# Patient Record
Sex: Male | Born: 1990 | Race: Black or African American | Hispanic: No | Marital: Single | Smoking: Current every day smoker
Health system: Southern US, Community
[De-identification: ages and names within clinical notes are randomized; demographics above are authoritative.]

---
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
## 2009-05-03 ENCOUNTER — Emergency Department: Payer: Self-pay | Admitting: Emergency Medicine

## 2010-09-24 ENCOUNTER — Emergency Department: Payer: Self-pay | Admitting: Emergency Medicine

## 2012-12-31 ENCOUNTER — Emergency Department: Payer: Self-pay | Admitting: Emergency Medicine

## 2015-05-06 ENCOUNTER — Emergency Department
Admission: EM | Admit: 2015-05-06 | Discharge: 2015-05-06 | Disposition: A | Discharge status: Home / Self Care | Payer: Self-pay | Attending: Emergency Medicine | Admitting: Emergency Medicine

## 2015-05-06 ENCOUNTER — Encounter: Payer: Self-pay | Admitting: Emergency Medicine

## 2015-05-06 DIAGNOSIS — F17200 Nicotine dependence, unspecified, uncomplicated: Secondary | ICD-10-CM | POA: Insufficient documentation

## 2015-05-06 DIAGNOSIS — B001 Herpesviral vesicular dermatitis: Secondary | ICD-10-CM | POA: Insufficient documentation

## 2015-05-06 MED ORDER — ACYCLOVIR 5 % EX OINT: TOPICAL_OINTMENT | CUTANEOUS | Status: AC

## 2015-05-06 MED ORDER — VALACYCLOVIR HCL 1 G PO TABS: 2000.0000 mg | ORAL_TABLET | Freq: Two times a day (BID) | ORAL | Status: DC

## 2015-05-06 NOTE — ED Notes (Signed)
Pt has swelling and redness to top right lip that came up tonight.

## 2015-05-06 NOTE — Discharge Instructions (Signed)
Cold Sore A cold sore (fever blister) is a skin infection caused by a certain type of germ (virus). They are small sores filled with fluid that dry up and heal within 2 weeks. Cold sores form inside of the mouth or on the lips, gums, and other parts of the body. Cold sores can be easily passed (contagious) to other people. This can happen through close personal contact, such as kissing or sharing a drinking glass. HOME CARE  Only take medicine as told by your doctor. Do not use aspirin.  Use a cotton-tip swab to put creams or gels on your sores.  Do not touch sores or pick scabs. Wash your hands often. Do not touch your eyes without washing your hands first.  Avoid kissing, oral sex, and sharing personal items until the sores heal.  Put an ice pack on your sores for 10-15 minutes to ease discomfort.  Avoid hot, cold, or salty foods. Eat a soft, bland diet. Use a straw to drink if it helps lessen pain.  Keep sores clean and dry.  Avoid the sun and limit stress if these things cause you to have sores. Apply sunscreen on your lips if the sun causes cold sores. GET HELP IF:  You have a fever or lasting symptoms for more than 2-3 days.  You have a fever and your symptoms suddenly get worse.  You have yellow-white fluid (not clear) coming from the sores.  You have redness that is spreading.  You have pain or irritation in your eye.  You get sores on your genitals.  Your sores do not heal within 2 weeks.  You have a tough time fighting off sickness and infections (weakened immune system).  You get cold sores often. MAKE SURE YOU:   Understand these instructions.  Will watch your condition.  Will get help right away if you are not doing well or get worse.   This information is not intended to replace advice given to you by your health care provider. Make sure you discuss any questions you have with your health care provider.   Document Released: 11/30/2011 Document Reviewed:  11/30/2011 Elsevier Interactive Patient Education [employer]2016 [employer]vier Inc.  Take the prescription meds as directed.

## 2015-05-06 NOTE — ED Provider Notes (Signed)
[facility] Emergency Department Provider Note ____________________________________________  Time seen: 2130  I have reviewed the triage vital signs and the nursing notes.  HISTORY  Chief Complaint  Oral Swelling  HPI John Spence is a 24 y.o. male awoke this morning and noted multiple,scabbed blisters to his upper lip on the right side. He does give a history of fever blisters or cold sores in the past.He admittedly peeled off the scab and squeeze the swelling to his upper lip prior to arrival. He denies fever, chills, sweats.  History reviewed. No pertinent past medical history.  There are no active problems to display for this patient.  History reviewed. No pertinent past surgical history.  Current Outpatient Rx  Name  Route  Sig  Dispense  Refill  . acyclovir ointment (ZOVIRAX) 5 %      Apply thin layer to affected area 6 times daily   15 g   0   . valACYclovir (VALTREX) 1000 MG tablet   Oral   Take 2 tablets (2,000 mg total) by mouth every 12 (twelve) hours. Herpes labialis   4 tablet   0    Allergies Review of patient's allergies indicates no known allergies.  History reviewed. No pertinent family history.  Social History Social History  Substance Use Topics  . Smoking status: Heavy Tobacco Smoker -- 0.50 packs/day  . Smokeless tobacco: None  . Alcohol Use: Yes   Review of Systems  Constitutional: Negative for fever. Eyes: Negative for visual changes. ENT: Negative for sore throat. Cardiovascular: Negative for chest pain. Respiratory: Negative for shortness of breath. Gastrointestinal: Negative for abdominal pain, vomiting and diarrhea. Genitourinary: Negative for dysuria. Musculoskeletal: Negative for back pain. Skin: Negative for rash. Lip swelling as above Neurological: Negative for headaches, focal weakness or numbness. ____________________________________________  PHYSICAL EXAM:  VITAL SIGNS: ED Triage Vitals  Enc  Vitals Group     BP 05/06/15 2023 139/80 mmHg     Pulse Rate 05/06/15 2023 94     Resp 05/06/15 2023 18     Temp 05/06/15 2023 98.6 F (37 C)     Temp Source 05/06/15 2023 Oral     SpO2 05/06/15 2023 100 %     Weight 05/06/15 2023 160 lb (72.576 kg)     Height 05/06/15 2023 6' (1.829 m)     Head Cir --      Peak Flow --      Pain Score 05/06/15 2030 0     Pain Loc --      Pain Edu? --      Excl. in GC? --    Constitutional: Alert and oriented. Well appearing and in no distress. Head: Normocephalic and atraumatic.      Eyes: Conjunctivae are normal. PERRL. Normal extraocular movements      Ears: Canals clear. TMs intact bilaterally.   Nose: No congestion/rhinorrhea.   Mouth/Throat: Mucous membranes are moist.   Neck: Supple. No thyromegaly. Hematological/Lymphatic/Immunological: No cervical lymphadenopathy. Cardiovascular: Normal rate, regular rhythm.  Respiratory: Normal respiratory effort. No wheezes/rales/rhonchi. Gastrointestinal: Soft and nontender. No distention. Musculoskeletal: Nontender with normal range of motion in all extremities.  Neurologic:  Normal gait without ataxia. Normal speech and language. No gross focal neurologic deficits are appreciated. Skin:  Skin is warm, dry and intact. No rash noted. Upper lip with local soft tissue swelling and superficial ulceration noted. Psychiatric: Mood and affect are normal. Patient exhibits appropriate insight and judgment. ____________________________________________  INITIAL IMPRESSION / ASSESSMENT AND PLAN /  ED COURSE  Treatment for a recurrent herpes labialis. Patient was treated with Zovirax ointment and valacyclovir. I make her provider for ongoing symptoms. ____________________________________________  FINAL CLINICAL IMPRESSION(S) / ED DIAGNOSES  Final diagnoses:  Recurrent herpes labialis      Lissa HoardJenise V Bacon Therisa Mennella, PA-C 05/06/15 2149  Richardean Canalavid H Yao, MD 05/06/15 2315

## 2015-05-06 NOTE — ED Notes (Signed)
Pt arrived to the ED for complaints of lip abrasion and swelling. Pt states that he woke up to go to work and noticed that his lip was swollen. Pt denies any other symptoms. Pt is AOx4 in no apparent distress.

## 2015-05-06 NOTE — ED Notes (Signed)
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 No hx of the same. Denies injury.

## 2015-09-06 ENCOUNTER — Emergency Department
Admission: EM | Admit: 2015-09-06 | Discharge: 2015-09-06 | Disposition: A | Discharge status: Home / Self Care | Payer: Self-pay | Attending: Emergency Medicine | Admitting: Emergency Medicine

## 2015-09-06 ENCOUNTER — Encounter: Payer: Self-pay | Admitting: Emergency Medicine

## 2015-09-06 DIAGNOSIS — J00 Acute nasopharyngitis [common cold]: Secondary | ICD-10-CM | POA: Insufficient documentation

## 2015-09-06 DIAGNOSIS — F17210 Nicotine dependence, cigarettes, uncomplicated: Secondary | ICD-10-CM | POA: Insufficient documentation

## 2015-09-06 LAB — POCT RAPID STREP A: STREPTOCOCCUS, GROUP A SCREEN (DIRECT): NEGATIVE

## 2015-09-06 NOTE — Discharge Instructions (Signed)

## 2015-09-06 NOTE — ED Provider Notes (Signed)
[facility] Emergency Department Provider Note  ____________________________________________  Time seen: Approximately 10:01 AM  I have reviewed the triage vital signs and the nursing notes.   HISTORY  Chief Complaint Sore Throat    HPI John Spence is a 25 y.o. male , NAD, presents to the emergency department with history of sore throat that began yesterday. Some nasal congestion, runny nose. Has not had any fevers, chills, body aches. He denies any abdominal pain, nausea, vomiting nor any rashes. Denies any sick contacts. Has not had any upper respiratory symptoms such as sinus pressure, ear pain, headaches.   History reviewed. No pertinent past medical history.  There are no active problems to display for this patient.   History reviewed. No pertinent past surgical history.  Current Outpatient Rx  Name  Route  Sig  Dispense  Refill  . acyclovir ointment (ZOVIRAX) 5 %      Apply thin layer to affected area 6 times daily   15 g   0   . valACYclovir (VALTREX) 1000 MG tablet   Oral   Take 2 tablets (2,000 mg total) by mouth every 12 (twelve) hours. Herpes labialis   4 tablet   0     Allergies Review of patient's allergies indicates no known allergies.  No family history on file.  Social History Social History  Substance Use Topics  . Smoking status: Heavy Tobacco Smoker -- 1.00 packs/day    Types: Cigarettes  . Smokeless tobacco: None  . Alcohol Use: Yes     Review of Systems  Constitutional: No fever/chills, fatigue. Eyes: No visual changes. No discharge, redness, swelling ENT: Positive sore throat, nasal congestion, runny nose. No ear pain, sinus pressure. Cardiovascular: No chest pain. Respiratory: No cough, shortness of breath, wheezing.  Gastrointestinal: No abdominal pain.  No nausea, vomiting.  No diarrhea.   Musculoskeletal: Negative for general myalgias.  Skin: Negative for rash. Neurological: Negative for headaches,  focal weakness or numbness. 10-point ROS otherwise negative.  ____________________________________________   PHYSICAL EXAM:  VITAL SIGNS: ED Triage Vitals  Enc Vitals Group     BP 09/06/15 0939 124/78 mmHg     Pulse Rate 09/06/15 0939 94     Resp 09/06/15 0939 20     Temp 09/06/15 0939 97.6 F (36.4 C)     Temp Source 09/06/15 0939 Oral     SpO2 09/06/15 0939 100 %     Weight 09/06/15 0939 160 lb (72.576 kg)     Height 09/06/15 0939  (1.854 m)     Head Cir --      Peak Flow --      Pain Score 09/06/15 0942 6     Pain Loc --      Pain Edu? --      Excl. in GC? --     Constitutional: Alert and oriented. Well appearing and in no acute distress. Eyes: Conjunctivae are normal.  Head: Atraumatic. ENT:      Ears: TMs visualized bilaterally without effusion, bulging, perforation, erythema      Nose: No congestion but trace clear rhinnorhea. Turbinates mildly injected      Mouth/Throat: Mucous membranes are moist. Pharynx with mild injection and clear postnasal drip. No pharyngeal swelling or exudates. Neck: Supple with full range of motion Hematological/Lymphatic/Immunilogical: No cervical lymphadenopathy. Cardiovascular: Normal rate, regular rhythm. Normal S1 and S2.  Good peripheral circulation. Respiratory: Normal respiratory effort without tachypnea or retractions. Lungs CTAB with breath sounds noted throughout. Neurologic:  Normal speech and language. No gross focal neurologic deficits are appreciated.  Skin:  Skin is warm, dry and intact. No rash noted. Psychiatric: Mood and affect are normal. Speech and behavior are normal. Patient exhibits appropriate insight and judgement.   ____________________________________________   LABS (all labs ordered are listed, but only abnormal results are displayed)  Labs Reviewed  POCT RAPID STREP A    ____________________________________________  EKG  None ____________________________________________  RADIOLOGY  None ____________________________________________    PROCEDURES  Procedure(s) performed: None    Medications - No data to display   ____________________________________________   INITIAL IMPRESSION / ASSESSMENT AND PLAN / ED COURSE  Pertinent lab results that were available during my care of the patient were reviewed by me and considered in my medical decision making (see chart for details).  Patient's diagnosis is consistent with acute nasopharyngitis. Patient will be discharged home with instructions to take over-the-counter cold medications as needed. Patient verbalizes he prefers to avoid medications and prefers a natural approach. I suggested he could take local honey on a daily basis to improve immunity and reduce seasonal allergies. Patient [facility] to [facility] if symptoms persist past this treatment course. Patient is given ED precautions to return to the ED for any worsening or new symptoms.    ____________________________________________  FINAL CLINICAL IMPRESSION(S) / ED DIAGNOSES  Final diagnoses:  Acute nasopharyngitis (common cold)      NEW MEDICATIONS STARTED DURING THIS VISIT:  New Prescriptions   No medications on file         Hope PigeonJami L Hagler, PA-C 09/06/15 1038  Jeanmarie PlantJames A McShane, MD 09/06/15 1228

## 2015-09-06 NOTE — ED Notes (Signed)
Sore throat since yesterday.

## 2015-12-11 ENCOUNTER — Emergency Department
Admission: EM | Admit: 2015-12-11 | Discharge: 2015-12-11 | Disposition: A | Discharge status: Home / Self Care | Payer: Self-pay | Attending: Emergency Medicine | Admitting: Emergency Medicine

## 2015-12-11 ENCOUNTER — Emergency Department: Payer: Self-pay

## 2015-12-11 DIAGNOSIS — X58XXXA Exposure to other specified factors, initial encounter: Secondary | ICD-10-CM | POA: Insufficient documentation

## 2015-12-11 DIAGNOSIS — Y999 Unspecified external cause status: Secondary | ICD-10-CM | POA: Insufficient documentation

## 2015-12-11 DIAGNOSIS — F17210 Nicotine dependence, cigarettes, uncomplicated: Secondary | ICD-10-CM | POA: Insufficient documentation

## 2015-12-11 DIAGNOSIS — T148XXA Other injury of unspecified body region, initial encounter: Secondary | ICD-10-CM

## 2015-12-11 DIAGNOSIS — S29012A Strain of muscle and tendon of back wall of thorax, initial encounter: Secondary | ICD-10-CM | POA: Insufficient documentation

## 2015-12-11 DIAGNOSIS — Y939 Activity, unspecified: Secondary | ICD-10-CM | POA: Insufficient documentation

## 2015-12-11 DIAGNOSIS — Y929 Unspecified place or not applicable: Secondary | ICD-10-CM | POA: Insufficient documentation

## 2015-12-11 MED ORDER — MELOXICAM 15 MG PO TABS: 15.0000 mg | ORAL_TABLET | Freq: Every day | ORAL | Status: DC
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
## 2015-12-11 NOTE — ED Notes (Addendum)
Pt arrives to ER via POV c/o upper back and lower neck pain X 2-3 months. Pt states that pain worse when he is at work. Pt alert and oriented X4, active, cooperative, pt in NAD. RR even and unlabored, color WNL.  Ambulatory.  Denies injury.

## 2016-12-11 ENCOUNTER — Emergency Department: Payer: Self-pay

## 2016-12-11 ENCOUNTER — Emergency Department
Admission: EM | Admit: 2016-12-11 | Discharge: 2016-12-11 | Discharge status: Against Medical Advice | Payer: Self-pay | Attending: Emergency Medicine | Admitting: Emergency Medicine

## 2016-12-11 DIAGNOSIS — M79622 Pain in left upper arm: Secondary | ICD-10-CM | POA: Insufficient documentation

## 2016-12-11 DIAGNOSIS — Z5321 Procedure and treatment not carried out due to patient leaving prior to being seen by health care provider: Secondary | ICD-10-CM | POA: Insufficient documentation
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
## 2016-12-12 ENCOUNTER — Emergency Department
Admission: EM | Admit: 2016-12-12 | Discharge: 2016-12-12 | Disposition: A | Discharge status: Home / Self Care | Payer: Self-pay | Attending: Emergency Medicine | Admitting: Emergency Medicine

## 2016-12-12 DIAGNOSIS — M791 Myalgia: Secondary | ICD-10-CM | POA: Insufficient documentation

## 2016-12-12 DIAGNOSIS — Z791 Long term (current) use of non-steroidal anti-inflammatories (NSAID): Secondary | ICD-10-CM | POA: Insufficient documentation

## 2016-12-12 DIAGNOSIS — F17210 Nicotine dependence, cigarettes, uncomplicated: Secondary | ICD-10-CM | POA: Insufficient documentation

## 2016-12-12 DIAGNOSIS — M7918 Myalgia, other site: Secondary | ICD-10-CM

## 2016-12-12 MED ORDER — NAPROXEN 500 MG PO TABS: 500.0000 mg | ORAL_TABLET | Freq: Two times a day (BID) | ORAL | 0 refills | Status: DC

## 2016-12-12 MED ORDER — CYCLOBENZAPRINE HCL 10 MG PO TABS: 10.0000 mg | ORAL_TABLET | Freq: Three times a day (TID) | ORAL | 0 refills | Status: DC | PRN
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
## 2016-12-12 NOTE — Discharge Instructions (Signed)
Follow up with the primary care provider of your choice for symptoms that are not improving over the next week or so. Return to the ER for symptoms that change or worsen if you are unable to schedule an appointment.
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
## 2017-02-20 ENCOUNTER — Emergency Department
Admission: EM | Admit: 2017-02-20 | Discharge: 2017-02-20 | Disposition: A | Discharge status: Home / Self Care | Payer: Self-pay | Attending: Emergency Medicine | Admitting: Emergency Medicine

## 2017-02-20 DIAGNOSIS — Z79899 Other long term (current) drug therapy: Secondary | ICD-10-CM | POA: Insufficient documentation

## 2017-02-20 DIAGNOSIS — A749 Chlamydial infection, unspecified: Secondary | ICD-10-CM | POA: Insufficient documentation

## 2017-02-20 DIAGNOSIS — F17210 Nicotine dependence, cigarettes, uncomplicated: Secondary | ICD-10-CM | POA: Insufficient documentation

## 2017-02-20 DIAGNOSIS — A64 Unspecified sexually transmitted disease: Secondary | ICD-10-CM

## 2017-02-20 LAB — CHLAMYDIA/NGC RT PCR (ARMC ONLY)
CHLAMYDIA TR: DETECTED — AB
N GONORRHOEAE: NOT DETECTED

## 2017-02-20 LAB — GLUCOSE, CAPILLARY: Glucose-Capillary: 83 mg/dL (ref 65–99)

## 2017-02-20 LAB — URINALYSIS, COMPLETE (UACMP) WITH MICROSCOPIC
Bacteria, UA: NONE SEEN
Bilirubin Urine: NEGATIVE
Glucose, UA: NEGATIVE mg/dL
Hgb urine dipstick: NEGATIVE
Ketones, ur: NEGATIVE mg/dL
Leukocytes, UA: NEGATIVE
Nitrite: NEGATIVE
PH: 5 (ref 5.0–8.0)
Protein, ur: NEGATIVE mg/dL
SPECIFIC GRAVITY, URINE: 1.031 — AB (ref 1.005–1.030)
SQUAMOUS EPITHELIAL / LPF: NONE SEEN

## 2017-02-20 LAB — BASIC METABOLIC PANEL
ANION GAP: 10 (ref 5–15)
BUN: 16 mg/dL (ref 6–20)
CO2: 27 mmol/L (ref 22–32)
Calcium: 9.7 mg/dL (ref 8.9–10.3)
Chloride: 103 mmol/L (ref 101–111)
Creatinine, Ser: 0.98 mg/dL (ref 0.61–1.24)
GFR calc Af Amer: >60 mL/min (ref >60)
Glucose, Bld: 85 mg/dL (ref 65–99)
POTASSIUM: 4 mmol/L (ref 3.5–5.1)
Sodium: 140 mmol/L (ref 135–145)

## 2017-02-20 LAB — CBC
HCT: 42 % (ref 40.0–52.0)
Hemoglobin: 14.6 g/dL (ref 13.0–18.0)
MCH: 32.3 pg (ref 26.0–34.0)
MCHC: 34.7 g/dL (ref 32.0–36.0)
MCV: 93 fL (ref 80.0–100.0)
PLATELETS: 240 10*3/uL (ref 150–440)
RBC: 4.52 MIL/uL (ref 4.40–5.90)
RDW: 13.5 % (ref 11.5–14.5)
WBC: 6.3 10*3/uL (ref 3.8–10.6)

## 2017-02-20 MED ORDER — AZITHROMYCIN 500 MG PO TABS
1000.0000 mg | ORAL_TABLET | Freq: Once | ORAL | Status: AC
  Administered 2017-02-20: 1000 mg via ORAL
  Filled 2017-02-20: qty 2

## 2017-02-20 MED ORDER — CEFTRIAXONE SODIUM 250 MG IJ SOLR
250.0000 mg | Freq: Once | INTRAMUSCULAR | Status: AC
  Administered 2017-02-20: 250 mg via INTRAMUSCULAR
  Filled 2017-02-20: qty 250

## 2017-02-20 NOTE — ED Triage Notes (Signed)
FIRST NURSE NOTE-ambulatory to triage. Migraine and increased urination. NAD

## 2017-02-20 NOTE — ED Triage Notes (Signed)
Pt presents via POV c/o urinary frequency x1 month. Requesting STD check.

## 2017-02-20 NOTE — ED Provider Notes (Signed)
[facility] Emergency Department Provider Note  ____________________________________________  Time seen: Approximately 2:23 PM  I have reviewed the triage vital signs and the nursing notes.   HISTORY  Chief Complaint Urinary Frequency    HPI John Spence is a 26 y.o. male that presents to the emergency department with urinary frequency for 1 month. Patient states that he finishes urination and feels like he needs to go again shortly after. He states that he is very sexually active and concerned for STDs. He states that "it's weird because I'm not having any pain anywhere." He denies fever, shortness breath, chest pain, nausea, vomiting, abdominal pain, back pain, dysuria, urgency, penile discharge.   No past medical history on file.  There are no active problems to display for this patient.   No past surgical history on file.  Prior to Admission medications   Medication Sig Start Date End Date Taking? Authorizing Provider  cyclobenzaprine (FLEXERIL) 10 MG tablet Take 1 tablet (10 mg total) by mouth 3 (three) times daily as needed for muscle spasms. 12/12/16   Triplett, Rulon Eisenmengerari B, FNP  meloxicam (MOBIC) 15 MG tablet Take 1 tablet (15 mg total) by mouth daily. 12/11/15   Triplett, Rulon Eisenmengerari B, FNP  naproxen (NAPROSYN) 500 MG tablet Take 1 tablet (500 mg total) by mouth 2 (two) times daily with a meal. 12/12/16   Triplett, Cari B, FNP  valACYclovir (VALTREX) 1000 MG tablet Take 2 tablets (2,000 mg total) by mouth every 12 (twelve) hours. Herpes labialis 05/06/15   Menshew, Charlesetta IvoryJenise V Bacon, PA-C    Allergies Patient has no known allergies.  No family history on file.  Social History Social History  Substance Use Topics  . Smoking status: Heavy Tobacco Smoker    Packs/day: 1.00    Types: Cigarettes  . Smokeless tobacco: Never Used  . Alcohol use Yes     Review of Systems  Cardiovascular: No chest pain. Respiratory:  No SOB. Gastrointestinal: No abdominal  pain.  No nausea, no vomiting.  Genitourinary: Negative for dysuria. Musculoskeletal: Negative for musculoskeletal pain. Skin: Negative for rash, abrasions, lacerations, ecchymosis. Neurological: Negative for headaches   ____________________________________________   PHYSICAL EXAM:  VITAL SIGNS: ED Triage Vitals [02/20/17 1104]  Enc Vitals Group     BP 140/84     Pulse Rate 64     Resp 14     Temp 98.4 F (36.9 C)     Temp Source Oral     SpO2 100 %     Weight 160 lb (72.6 kg)     Height 5\' 11"  (1.803 m)     Head Circumference      Peak Flow      Pain Score      Pain Loc      Pain Edu?      Excl. in GC?      Constitutional: Alert and oriented. Well appearing and in no acute distress. Eyes: Conjunctivae are normal. PERRL. EOMI. Head: Atraumatic. ENT:      Ears:      Nose: No congestion/rhinnorhea.      Mouth/Throat: Mucous membranes are moist.  Neck: No stridor.   Cardiovascular: Normal rate, regular rhythm.  Good peripheral circulation. Respiratory: Normal respiratory effort without tachypnea or retractions. Lungs CTAB. Good air entry to the bases with no decreased or absent breath sounds. Gastrointestinal: Bowel sounds 4 quadrants. Soft and nontender to palpation. No guarding or rigidity. No palpable masses. No distention. No CVA tenderness. Musculoskeletal: Full range  of motion to all extremities. No gross deformities appreciated. Neurologic:  Normal speech and language. No gross focal neurologic deficits are appreciated.  Skin:  Skin is warm, dry and intact. No rash noted.   ____________________________________________   LABS (all labs ordered are listed, but only abnormal results are displayed)  Labs Reviewed  CHLAMYDIA/NGC RT PCR ([facility] ONLY) - Abnormal; Notable for the following:       Result Value   Chlamydia Tr DETECTED (*)    All other components within normal limits  URINALYSIS, COMPLETE (UACMP) WITH MICROSCOPIC - Abnormal; Notable for  the following:    Color, Urine YELLOW (*)    APPearance CLEAR (*)    Specific Gravity, Urine 1.031 (*)    All other components within normal limits  GLUCOSE, CAPILLARY  CBC  BASIC METABOLIC PANEL  CBG MONITORING, ED   ____________________________________________  EKG   ____________________________________________  RADIOLOGY  No results found.  ____________________________________________    PROCEDURES  Procedure(s) performed:    Procedures    Medications  cefTRIAXone (ROCEPHIN) injection 250 mg (250 mg Intramuscular Given 02/20/17 1508)  azithromycin (ZITHROMAX) tablet 1,000 mg (1,000 mg Oral Given 02/20/17 1504)     ____________________________________________   INITIAL IMPRESSION / ASSESSMENT AND PLAN / ED COURSE  Pertinent labs & imaging results that were available during my care of the patient were reviewed by me and considered in my medical decision making (see chart for details).  Review of the [place] CSRS was performed in accordance of the [facility] prior to dispensing any controlled drugs.   Patients diagnosis is consistent with chlamydia. Patient presented to the emergency department for evaluation of urinary frequency. Vital signs, exam, lab work are reassuring. No infection on urinalysis. Chlamydia test is positive. Patient refuses rectal exam to check for prostatitis. He was treated for chlamydia with ceftriaxone and azithromycin. Education about STDs was provided. Patient is to follow up with PCP as directed. Patient is given ED precautions to return to the ED for any worsening or new symptoms.     ____________________________________________  FINAL CLINICAL IMPRESSION(S) / ED DIAGNOSES  Final diagnoses:  STD (sexually transmitted disease)  Chlamydia      NEW MEDICATIONS STARTED DURING THIS VISIT:  Discharge Medication List as of 02/20/2017  3:13 PM          This chart was dictated using voice recognition software/Dragon. Despite best efforts  to proofread, errors can occur which can change the meaning. Any change was purely unintentional.    Enid Derry, PA-C 02/20/17 1706    Sharyn Creamer, MD 02/26/17 2157

## 2017-04-27 ENCOUNTER — Other Ambulatory Visit: Payer: Self-pay

## 2017-04-27 ENCOUNTER — Emergency Department
Admission: EM | Admit: 2017-04-27 | Discharge: 2017-04-27 | Disposition: A | Discharge status: Home / Self Care | Payer: Self-pay | Attending: Emergency Medicine | Admitting: Emergency Medicine

## 2017-04-27 ENCOUNTER — Encounter: Payer: Self-pay | Admitting: *Deleted

## 2017-04-27 DIAGNOSIS — Z791 Long term (current) use of non-steroidal anti-inflammatories (NSAID): Secondary | ICD-10-CM | POA: Insufficient documentation

## 2017-04-27 DIAGNOSIS — B349 Viral infection, unspecified: Secondary | ICD-10-CM | POA: Insufficient documentation

## 2017-04-27 DIAGNOSIS — F17210 Nicotine dependence, cigarettes, uncomplicated: Secondary | ICD-10-CM | POA: Insufficient documentation

## 2017-04-27 LAB — INFLUENZA PANEL BY PCR (TYPE A & B)
Influenza A By PCR: NEGATIVE
Influenza B By PCR: NEGATIVE

## 2017-04-27 LAB — POCT RAPID STREP A: Streptococcus, Group A Screen (Direct): NEGATIVE

## 2017-04-27 MED ORDER — PSEUDOEPH-BROMPHEN-DM 30-2-10 MG/5ML PO SYRP: 5.0000 mL | ORAL_SOLUTION | Freq: Four times a day (QID) | ORAL | 0 refills | Status: DC | PRN

## 2017-04-27 MED ORDER — IBUPROFEN 800 MG PO TABS: 800.0000 mg | ORAL_TABLET | Freq: Three times a day (TID) | ORAL | 0 refills | Status: DC | PRN

## 2017-04-27 NOTE — ED Provider Notes (Signed)
[facility] Emergency Department Provider Note   ____________________________________________   First MD Initiated Contact with Patient 04/27/17 [phone]     (approximate)  I have reviewed the triage vital signs and the nursing notes.   HISTORY  Chief Complaint Generalized Body Aches    HPI John Spence is a 26 y.o. male patient complainingof onset of sore throat, nasal congestion, cough, and body aches. Patient denies fever, nausea, vomiting, or diarrhea. Patient state he feels weak. 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 No flu shot this season. No palliative measures for her complaint. History reviewed. No pertinent past medical history.  There are no active problems to display for this patient.   History reviewed. No pertinent surgical history.  Prior to Admission medications   Medication Sig Start Date End Date Taking? Authorizing Provider  brompheniramine-pseudoephedrine-DM 30-2-10 MG/5ML syrup Take 5 mLs 4 (four) times daily as needed by mouth. 04/27/17   Joni ReiningSmith, Ronald K, PA-C  cyclobenzaprine (FLEXERIL) 10 MG tablet Take 1 tablet (10 mg total) by mouth 3 (three) times daily as needed for muscle spasms. 12/12/16   Triplett, Cari B, FNP  ibuprofen (ADVIL,MOTRIN) 800 MG tablet Take 1 tablet (800 mg total) every 8 (eight) hours as needed by mouth for moderate pain. 04/27/17   Joni ReiningSmith, Ronald K, PA-C  meloxicam (MOBIC) 15 MG tablet Take 1 tablet (15 mg total) by mouth daily. 12/11/15   Triplett, Rulon Eisenmengerari B, FNP  naproxen (NAPROSYN) 500 MG tablet Take 1 tablet (500 mg total) by mouth 2 (two) times daily with a meal. 12/12/16   Triplett, Cari B, FNP  valACYclovir (VALTREX) 1000 MG tablet Take 2 tablets (2,000 mg total) by mouth every 12 (twelve) hours. Herpes labialis 05/06/15   Menshew, Charlesetta IvoryJenise V Bacon, PA-C    Allergies Patient has no known allergies.  No family history on file.  Social History Social History   Tobacco Use    . Smoking status: Heavy Tobacco Smoker    Packs/day: 1.00    Types: Cigarettes  . Smokeless tobacco: Never Used  Substance Use Topics  . Alcohol use: Yes  . Drug use: No    Review of Systems Constitutional: No fever/chills. Body aches Eyes: No visual changes. ENT: Sore throat Cardiovascular: Denies chest pain. Respiratory: Denies shortness of breath. Gastrointestinal: No abdominal pain.  No nausea, no vomiting.  No diarrhea.  No constipation. Genitourinary: Negative for dysuria. Musculoskeletal: Negative for back pain. Skin: Negative for rash. Neurological: Negative for headaches, focal weakness or numbness.   ____________________________________________   PHYSICAL EXAM:  VITAL SIGNS: ED Triage Vitals [04/27/17 0908]  Enc Vitals Group     BP 138/86     Pulse Rate 78     Resp 20     Temp 98.2 F (36.8 C)     Temp Source Oral     SpO2 98 %     Weight 160 lb (72.6 kg)     Height 6' (1.829 m)     Head Circumference      Peak Flow      Pain Score      Pain Loc      Pain Edu?      Excl. in GC?     Constitutional: Alert and oriented. Well appearing and in no acute distress. Nose: Edematous nasal turbinates with clear rhinorrhea Mouth/Throat: Mucous membranes are moist.  Oropharynx non-erythematous. Postnasal tree Neck: No stridor.   Hematological/Lymphatic/Immunilogical: No cervical lymphadenopathy. Cardiovascular: Normal rate, regular  rhythm. Grossly normal heart sounds.  Good peripheral circulation. Respiratory: Normal respiratory effort.  No retractions. Lungs CTAB. Neurologic:  Normal speech and language. No gross focal neurologic deficits are appreciated. No gait instability. Skin:  Skin is warm, dry and intact. No rash noted. Psychiatric: Mood and affect are normal. Speech and behavior are normal.  ____________________________________________   LABS (all labs ordered are listed, but only abnormal results are displayed)  Labs Reviewed  CULTURE, GROUP A  STREP [facility]  INFLUENZA PANEL BY PCR (TYPE A & B)  POCT RAPID STREP A   ____________________________________________  EKG   ____________________________________________  RADIOLOGY  No results found.  ____________________________________________   PROCEDURES  Procedure(s) performed: None  Procedures  Critical Care performed: No  ____________________________________________   INITIAL IMPRESSION / ASSESSMENT AND PLAN / ED COURSE  As part of my medical decision making, I reviewed the following data within the electronic MEDICAL RECORD NUMBER    Patient was sent for sore throat and body aches and cough secondary to viral illness. Discussed negative rapid strep and flu results with patient. Patient given discharge Instruction work no. Patient does take medication as directed. Patient [facility]      ____________________________________________   FINAL CLINICAL IMPRESSION(S) / ED DIAGNOSES  Final diagnoses:  Viral illness     ED Discharge Orders        Ordered    brompheniramine-pseudoephedrine-DM 30-2-10 MG/5ML syrup  4 times daily PRN     04/27/17 1125    ibuprofen (ADVIL,MOTRIN) 800 MG tablet  Every 8 hours PRN     04/27/17 1125       Note:  This document was prepared using Dragon voice recognition software and may include unintentional dictation errors.    Joni ReiningSmith, Ronald K, PA-C 04/27/17 1128    Emily FilbertWilliams, Jonathan E, MD 04/27/17 [phone]

## 2017-04-27 NOTE — ED Triage Notes (Signed)
Pt complains of  A sore throat and body ached starting yesterday, pt denies fever

## 2017-04-30 LAB — CULTURE, GROUP A STREP (THRC)

## 2018-02-13 ENCOUNTER — Encounter: Payer: Self-pay | Admitting: *Deleted

## 2018-02-13 ENCOUNTER — Emergency Department
Admission: EM | Admit: 2018-02-13 | Discharge: 2018-02-13 | Disposition: A | Discharge status: Home / Self Care | Payer: Self-pay | Attending: Student in an Organized Health Care Education/Training Program | Admitting: Student in an Organized Health Care Education/Training Program

## 2018-02-13 ENCOUNTER — Other Ambulatory Visit: Payer: Self-pay

## 2018-02-13 DIAGNOSIS — F17210 Nicotine dependence, cigarettes, uncomplicated: Secondary | ICD-10-CM | POA: Insufficient documentation

## 2018-02-13 DIAGNOSIS — Z79899 Other long term (current) drug therapy: Secondary | ICD-10-CM | POA: Insufficient documentation

## 2018-02-13 DIAGNOSIS — J069 Acute upper respiratory infection, unspecified: Secondary | ICD-10-CM | POA: Insufficient documentation

## 2018-02-13 MED ORDER — PSEUDOEPH-BROMPHEN-DM 30-2-10 MG/5ML PO SYRP: 5.0000 mL | ORAL_SOLUTION | Freq: Four times a day (QID) | ORAL | 0 refills | Status: DC | PRN

## 2018-02-13 MED ORDER — FLUTICASONE PROPIONATE 50 MCG/ACT NA SUSP: 2.0000 | Freq: Every day | NASAL | 0 refills | Status: DC

## 2018-02-13 NOTE — ED Provider Notes (Signed)
[facility] Emergency Department Provider Note  ____________________________________________  Time seen: Approximately 8:35 PM  I have reviewed the triage vital signs and the nursing notes.   HISTORY  Chief Complaint Generalized Body Aches    HPI John Spence is a 27 y.o. male that presents emergency department for evaluation of watering eyes, nasal congestion, body aches for 1 day.  Symptoms started after he took a shower and immediately went outside.  Patient smokes 1-1/2 packs of cigarettes per day.  His cousin's girlfriend is sick with similar symptoms.  He has not checked his temperature but has not felt hot or had chills.  He is unsure if he has allergies.  No cough, shortness of breath, vomiting, abdominal pain.   No past medical history on file.  There are no active problems to display for this patient.   No past surgical history on file.  Prior to Admission medications   Medication Sig Start Date End Date Taking? Authorizing Provider  brompheniramine-pseudoephedrine-DM 30-2-10 MG/5ML syrup Take 5 mLs by mouth 4 (four) times daily as needed. 02/13/18   Enid Derry, PA-C  cyclobenzaprine (FLEXERIL) 10 MG tablet Take 1 tablet (10 mg total) by mouth 3 (three) times daily as needed for muscle spasms. 12/12/16   Triplett, Cari B, FNP  fluticasone (FLONASE) 50 MCG/ACT nasal spray Place 2 sprays into both nostrils daily. 02/13/18 02/13/19  Enid Derry, PA-C  ibuprofen (ADVIL,MOTRIN) 800 MG tablet Take 1 tablet (800 mg total) every 8 (eight) hours as needed by mouth for moderate pain. 04/27/17   Joni Reining, PA-C  meloxicam (MOBIC) 15 MG tablet Take 1 tablet (15 mg total) by mouth daily. 12/11/15   Triplett, Rulon Eisenmenger B, FNP  naproxen (NAPROSYN) 500 MG tablet Take 1 tablet (500 mg total) by mouth 2 (two) times daily with a meal. 12/12/16   Triplett, Cari B, FNP  valACYclovir (VALTREX) 1000 MG tablet Take 2 tablets (2,000 mg total) by mouth every 12 (twelve) hours.  Herpes labialis 05/06/15   Menshew, Charlesetta Ivory, PA-C    Allergies Patient has no known allergies.  No family history on file.  Social History Social History   Tobacco Use  . Smoking status: Heavy Tobacco Smoker    Packs/day: 1.00    Types: Cigarettes  . Smokeless tobacco: Never Used  Substance Use Topics  . Alcohol use: Yes  . Drug use: No     Review of Systems  Constitutional: No fever/chills Eyes: No visual changes. No discharge. ENT: Positive for congestion and rhinorrhea. No sore throat.  Cardiovascular: No chest pain. Respiratory: Negative for cough. No SOB. Gastrointestinal: No abdominal pain.  No nausea, no vomiting.  No diarrhea.  No constipation. Musculoskeletal: Negative for musculoskeletal pain. Skin: Negative for rash, abrasions, lacerations, ecchymosis. Neurological: Negative for headaches.   ____________________________________________   PHYSICAL EXAM:  VITAL SIGNS: ED Triage Vitals  Enc Vitals Group     BP 02/13/18 1944 134/81     Pulse Rate 02/13/18 1944 61     Resp 02/13/18 1944 20     Temp 02/13/18 1944 99.4 F (37.4 C)     Temp Source 02/13/18 1944 Oral     SpO2 02/13/18 1944 100 %     Weight 02/13/18 1946 167 lb (75.8 kg)     Height 02/13/18 1946 5\' 11"  (1.803 m)     Head Circumference --      Peak Flow --      Pain Score 02/13/18 1946 10  Pain Loc --      Pain Edu? --      Excl. in GC? --      Constitutional: Alert and oriented. Well appearing and in no acute distress. Eyes: Conjunctivae are normal. PERRL. EOMI. No discharge. Head: Atraumatic. ENT: No frontal and maxillary sinus tenderness.      Ears: Tympanic membranes pearly gray with good landmarks. No discharge.      Nose: Mild congestion/rhinnorhea.      Mouth/Throat: Mucous membranes are moist. Oropharynx non-erythematous. Tonsils not enlarged. No exudates. Uvula midline. Neck: No stridor.   Hematological/Lymphatic/Immunilogical: No cervical  lymphadenopathy. Cardiovascular: Normal rate, regular rhythm.  Good peripheral circulation. Respiratory: Normal respiratory effort without tachypnea or retractions. Lungs CTAB. Good air entry to the bases with no decreased or absent breath sounds. Gastrointestinal: Bowel sounds 4 quadrants. Soft and nontender to palpation. No guarding or rigidity. No palpable masses. No distention. Musculoskeletal: Full range of motion to all extremities. No gross deformities appreciated. Neurologic:  Normal speech and language. No gross focal neurologic deficits are appreciated.  Skin:  Skin is warm, dry and intact. No rash noted. Psychiatric: Mood and affect are normal. Speech and behavior are normal. Patient exhibits appropriate insight and judgement.   ____________________________________________   LABS (all labs ordered are listed, but only abnormal results are displayed)  Labs Reviewed - No data to display ____________________________________________  EKG   ____________________________________________  RADIOLOGY  No results found.  ____________________________________________    PROCEDURES  Procedure(s) performed:    Procedures    Medications - No data to display   ____________________________________________   INITIAL IMPRESSION / ASSESSMENT AND PLAN / ED COURSE  Pertinent labs & imaging results that were available during my care of the patient were reviewed by me and considered in my medical decision making (see chart for details).  Review of the [place] CSRS was performed in accordance of the [facility] prior to dispensing any controlled drugs.   Patient's diagnosis is consistent with viral URI and seasonal allergies. Vital signs and exam are reassuring. Patient appears well and is staying well hydrated. Patient should alternate tylenol and ibuprofen for fever. Patient feels comfortable going home. Patient will be discharged home with prescriptions for bromfed and flonase. Patient  is to follow up with PCP as needed or otherwise directed. Patient is given ED precautions to return to the ED for any worsening or new symptoms.     ____________________________________________  FINAL CLINICAL IMPRESSION(S) / ED DIAGNOSES  Final diagnoses:  Viral URI      NEW MEDICATIONS STARTED DURING THIS VISIT:  ED Discharge Orders         Ordered    brompheniramine-pseudoephedrine-DM 30-2-10 MG/5ML syrup  4 times daily PRN     02/13/18 2046    fluticasone (FLONASE) 50 MCG/ACT nasal spray  Daily     02/13/18 2046              This chart was dictated using voice recognition software/Dragon. Despite best efforts to proofread, errors can occur which can change the meaning. Any change was purely unintentional.    Enid Derry, PA-C 02/13/18 2247    Willy Eddy, MD 02/13/18 2326

## 2018-02-13 NOTE — ED Triage Notes (Signed)
Pt has body aches, runny nose.  Sx began last night.  Pt alert.  Speech clear.

## 2018-02-13 NOTE — ED Notes (Signed)
Pt c/o generalized body aches, runny nose, swollen glands, watery eyes. Pt has taken nothing for pain prior to arrival. Pt states sxs started last night, worsening today.

## 2018-03-14 ENCOUNTER — Emergency Department
Admission: EM | Admit: 2018-03-14 | Discharge: 2018-03-14 | Disposition: A | Discharge status: Home / Self Care | Payer: Self-pay | Attending: Emergency Medicine | Admitting: Emergency Medicine

## 2018-03-14 ENCOUNTER — Other Ambulatory Visit: Payer: Self-pay

## 2018-03-14 DIAGNOSIS — Z79899 Other long term (current) drug therapy: Secondary | ICD-10-CM | POA: Insufficient documentation

## 2018-03-14 DIAGNOSIS — K029 Dental caries, unspecified: Secondary | ICD-10-CM

## 2018-03-14 DIAGNOSIS — F17210 Nicotine dependence, cigarettes, uncomplicated: Secondary | ICD-10-CM | POA: Insufficient documentation

## 2018-03-14 DIAGNOSIS — K0889 Other specified disorders of teeth and supporting structures: Secondary | ICD-10-CM

## 2018-03-14 MED ORDER — DOCUSATE SODIUM 100 MG PO CAPS: ORAL_CAPSULE | ORAL | 0 refills | Status: DC

## 2018-03-14 MED ORDER — IBUPROFEN 600 MG PO TABS: ORAL_TABLET | ORAL | 0 refills | Status: DC

## 2018-03-14 MED ORDER — HYDROCODONE-ACETAMINOPHEN 5-325 MG PO TABS: 1.0000 | ORAL_TABLET | ORAL | 0 refills | Status: DC | PRN

## 2018-03-14 MED ORDER — PENICILLIN V POTASSIUM 250 MG PO TABS
500.0000 mg | ORAL_TABLET | Freq: Once | ORAL | Status: AC
  Administered 2018-03-14: 500 mg via ORAL
  Filled 2018-03-14: qty 2

## 2018-03-14 MED ORDER — KETOROLAC TROMETHAMINE 30 MG/ML IJ SOLN: 15.0000 mg | Freq: Once | INTRAMUSCULAR | Status: DC

## 2018-03-14 MED ORDER — IBUPROFEN 800 MG PO TABS
ORAL_TABLET | ORAL | Status: AC
  Filled 2018-03-14: qty 1

## 2018-03-14 MED ORDER — PENICILLIN V POTASSIUM 500 MG PO TABS: 500.0000 mg | ORAL_TABLET | Freq: Four times a day (QID) | ORAL | 0 refills | Status: DC

## 2018-03-14 MED ORDER — OXYCODONE-ACETAMINOPHEN 5-325 MG PO TABS
2.0000 | ORAL_TABLET | Freq: Once | ORAL | Status: AC
  Administered 2018-03-14: 2 via ORAL
  Filled 2018-03-14: qty 2

## 2018-03-14 MED ORDER — IBUPROFEN 800 MG PO TABS
800.0000 mg | ORAL_TABLET | Freq: Once | ORAL | Status: AC
  Administered 2018-03-14: 800 mg via ORAL

## 2018-03-14 NOTE — ED Notes (Signed)
Patient up to desk asking if any way we could move things faster, explained unable to move faster.  Offered patient ibuprofen for pain, but patient refused.

## 2018-03-14 NOTE — ED Provider Notes (Signed)
[facility] Emergency Department Provider Note  ____________________________________________   First MD Initiated Contact with Patient 03/14/18 [phone]     (approximate)  I have reviewed the triage vital signs and the nursing notes.   HISTORY  Chief Complaint Dental Pain    HPI John Spence is a 27 y.o. male with no chronic medical history who presents for evaluation of acute onset dental pain.  He reports that the pain is started within the last 24 hours and is severe.  It is a throbbing aching pain and he says he can see a hole in the tooth.  He knows he needs to go see a dentist but he feels like he cannot make it through the night.  He has not tried taking anything at home.  It is the back tooth on the left lower side.  He has had no fever/chills, chest pain, shortness of breath, nausea, vomiting, or swelling of the face.  History reviewed. No pertinent past medical history.  There are no active problems to display for this patient.   History reviewed. No pertinent surgical history.  Prior to Admission medications   Medication Sig Start Date End Date Taking? Authorizing Provider  brompheniramine-pseudoephedrine-DM 30-2-10 MG/5ML syrup Take 5 mLs by mouth 4 (four) times daily as needed. 02/13/18   Enid Derry, PA-C  cyclobenzaprine (FLEXERIL) 10 MG tablet Take 1 tablet (10 mg total) by mouth 3 (three) times daily as needed for muscle spasms. 12/12/16   Chinita Pester, FNP  docusate sodium (COLACE) 100 MG capsule Take 1 tablet once or twice daily as needed for constipation while taking narcotic pain medicine 03/14/18   Loleta Rose, MD  fluticasone [facility]) 50 MCG/ACT nasal spray Place 2 sprays into both nostrils daily. 02/13/18 02/13/19  Enid Derry, PA-C  HYDROcodone-acetaminophen (NORCO/VICODIN) 5-325 MG tablet Take 1-2 tablets by mouth every 4 (four) hours as needed for moderate pain. 03/14/18   Loleta Rose, MD  ibuprofen (ADVIL,MOTRIN) 600 MG tablet  Take 1 tablet by mouth three times daily with meals 03/14/18   Loleta Rose, MD  meloxicam (MOBIC) 15 MG tablet Take 1 tablet (15 mg total) by mouth daily. 12/11/15   Triplett, Rulon Eisenmenger B, FNP  naproxen (NAPROSYN) 500 MG tablet Take 1 tablet (500 mg total) by mouth 2 (two) times daily with a meal. 12/12/16   Triplett, Cari B, FNP  penicillin v potassium (VEETID) 500 MG tablet Take 1 tablet (500 mg total) by mouth 4 (four) times daily. 03/14/18   Loleta Rose, MD  valACYclovir (VALTREX) 1000 MG tablet Take 2 tablets (2,000 mg total) by mouth every 12 (twelve) hours. Herpes labialis 05/06/15   Menshew, Charlesetta Ivory, PA-C    Allergies Patient has no known allergies.  No family history on file.  Social History Social History   Tobacco Use  . Smoking status: Heavy Tobacco Smoker    Packs/day: 1.00    Types: Cigarettes  . Smokeless tobacco: Never Used  Substance Use Topics  . Alcohol use: Yes  . Drug use: No    Review of Systems Constitutional: No fever/chills ENT: Left lower rear tooth dental pain as described above with no swelling Cardiovascular: Denies chest pain. Respiratory: Denies shortness of breath. Gastrointestinal: No abdominal pain.  No nausea, no vomiting.   Musculoskeletal: Negative for neck pain.  Negative for back pain. Integumentary: Negative for rash. Neurological: Negative for headaches, focal weakness or numbness.   ____________________________________________   PHYSICAL EXAM:  VITAL SIGNS: ED Triage Vitals  Enc Vitals Group     BP --      Pulse Rate 03/14/18 0201 (!) 53     Resp 03/14/18 0201 18     Temp 03/14/18 0201 (!) 97.5 F (36.4 C)     Temp Source 03/14/18 0201 Oral     SpO2 03/14/18 0201 99 %     Weight 03/14/18 0159 72.6 kg (160 lb)     Height 03/14/18 0159 1.803 m (5\' 11" )     Head Circumference --      Peak Flow --      Pain Score 03/14/18 0159 10     Pain Loc --      Pain Edu? --      Excl. in GC? --     Constitutional: Alert and  oriented.  Appears acutely uncomfortable. Eyes: Conjunctivae are normal.  Head: Atraumatic. Mouth/Throat: Mucous membranes are moist.  No swelling, no evidence of Ludwig's angina nor of any large dental abscesses, peritonsillar abscess, etc.  The patient does have what appears to be chronic dental caries of tooth #17 with what appears to be relatively superficial fracture likely due to the degree of decay.  No evidence of abscess.  Tender to palpation of the surrounding jaw without any obvious swelling. Neck: No stridor.  No meningeal signs.   Cardiovascular: Normal rate, regular rhythm.  Respiratory: Normal respiratory effort.  No retractions.  Neurologic:  Normal speech and language. No gross focal neurologic deficits are appreciated.  Skin:  Skin is warm, dry and intact. No rash noted. Psychiatric: Mood and affect are anxious due to his acute pain.  ____________________________________________   LABS (all labs ordered are listed, but only abnormal results are displayed)  Labs Reviewed - No data to display ____________________________________________  EKG  No indication for EKG ____________________________________________  RADIOLOGY   ED MD interpretation: No indication for imaging  Official radiology report(s): No results found.  ____________________________________________   PROCEDURES  Critical Care performed: No   Procedure(s) performed:   Procedures   ____________________________________________   INITIAL IMPRESSION / ASSESSMENT AND PLAN / ED COURSE  As part of my medical decision making, I reviewed the following data within the electronic MEDICAL RECORD NUMBER Old chart reviewed, Notes from prior ED visits and [place] Controlled Substance Database    Dental caries and what appears to likely be of the relatively superficial dental fracture.  The patient does appear quite uncomfortable.  He has no records in the [place] controlled substance database and I  think that under the circumstances it is appropriate to give him a short course of Norco as well as ibuprofen.  I am also giving him penicillin so that he can start treatment and hopefully get his tooth fixed at the next available opportunity.  I counseled him that this is a one-time prescription in the ED and that he must go to a dentist.  I am giving him the dental resource guide.  He states he understands and agrees with the plan.  He has a family member to drive him home.     ____________________________________________  FINAL CLINICAL IMPRESSION(S) / ED DIAGNOSES  Final diagnoses:  Pain, dental  Dental caries     MEDICATIONS GIVEN DURING THIS VISIT:  Medications  oxyCODONE-acetaminophen (PERCOCET/ROXICET) 5-325 MG per tablet 2 tablet (has no administration in time range)  penicillin v potassium (VEETID) tablet 500 mg (has no administration in time range)  ibuprofen (ADVIL,MOTRIN) tablet 800 mg ( Oral Not Given 03/14/18 0342)  ED Discharge Orders         Ordered    ibuprofen (ADVIL,MOTRIN) 600 MG tablet     03/14/18 0355    HYDROcodone-acetaminophen (NORCO/VICODIN) 5-325 MG tablet  Every 4 hours PRN     03/14/18 0355    penicillin v potassium (VEETID) 500 MG tablet  4 times daily     03/14/18 0355    docusate sodium (COLACE) 100 MG capsule     03/14/18 0355           Note:  This document was prepared using Dragon voice recognition software and may include unintentional dictation errors.    Loleta Rose, MD 03/14/18 [phone]

## 2018-03-14 NOTE — ED Triage Notes (Addendum)
Pt arrives to ED via POV from home with c/o left-sided lower dental pain x1 day. No Tylenol, Ibuprofen or other OTC pain medications taken PTA.

## 2018-03-14 NOTE — Discharge Instructions (Signed)
You have been seen in the Emergency Department (ED) today for dental pain.  Please take your prescribed antibiotic.  You may take pain medication as needed but ONLY as prescribed.  You should also take over-the-counter pain medication such as ibuprofen according to the label instructions unless a doctor has previously told you to avoid this type of medication (due to stomach ulcers, for example).  Alternatively you can take ibuprofen 600 mg by mouth three times daily with meals for no more than 5 days. ° °Take Norco as prescribed for severe pain. Do not drink alcohol, drive or participate in any other potentially dangerous activities while taking this medication as it may make you sleepy. Do not take this medication with any other sedating medications, either prescription or over-the-counter. If you were prescribed Percocet or Vicodin, do not take these with acetaminophen (Tylenol) as it is already contained within these medications. °  °This medication is an opiate (or narcotic) pain medication and can be habit forming.  Use it as little as possible to achieve adequate pain control.  Do not use or use it with extreme caution if you have a history of opiate abuse or dependence.  If you are on a pain contract with your primary care doctor or a pain specialist, be sure to let them know you were prescribed this medication today from the [facility].  This medication is intended for your use only - do not give any to anyone else and keep it in a secure place where nobody else, especially children, have access to it.  It will also cause or worsen constipation, so you may want to consider taking an over-the-counter stool softener while you are taking this medication. ° °Please see you dentist as soon as possible; only a dentist will be able to fix your problem(s).  Please see below for dental follow up options. ° °Return to the ED if you develop worsening pain, fever, pus/drainage, difficulty  breathing, or other symptoms that concern you. °

## 2018-06-02 ENCOUNTER — Emergency Department
Admission: EM | Admit: 2018-06-02 | Discharge: 2018-06-02 | Disposition: A | Discharge status: Home / Self Care | Payer: Self-pay | Attending: Emergency Medicine | Admitting: Emergency Medicine

## 2018-06-02 ENCOUNTER — Encounter: Payer: Self-pay | Admitting: Emergency Medicine

## 2018-06-02 DIAGNOSIS — L700 Acne vulgaris: Secondary | ICD-10-CM | POA: Insufficient documentation

## 2018-06-02 DIAGNOSIS — Z79899 Other long term (current) drug therapy: Secondary | ICD-10-CM | POA: Insufficient documentation

## 2018-06-02 DIAGNOSIS — F17210 Nicotine dependence, cigarettes, uncomplicated: Secondary | ICD-10-CM | POA: Insufficient documentation

## 2018-06-02 NOTE — Discharge Instructions (Signed)
Continue use of your proactive.  Used an exfoliant body wash.

## 2018-06-02 NOTE — ED Provider Notes (Signed)
[facility] Emergency Department Provider Note  ____________________________________________  Time seen: Approximately 2:54 PM  I have reviewed the triage vital signs and the nursing notes.   HISTORY  Chief Complaint Rash    HPI John Spence is a 27 y.o. male who presents the emergency department complaining of 3 "bumps" to his back.  Patient reports that he noticed the areas in the mirror.  He is concerned as he has had acne on his back in the past.  Patient used his proactive when he has an outbreak.  He is not currently using these topicals.  Patient is concerned that it may be a "rash" or a "boil."  Patient denies any other complaints.  He denies any fevers or chills, viral URI symptoms, abdominal pain, nausea or vomiting.  Patient has not tried any medications for this complaint.    History reviewed. No pertinent past medical history.  There are no active problems to display for this patient.   History reviewed. No pertinent surgical history.  Prior to Admission medications   Medication Sig Start Date End Date Taking? Authorizing Provider  brompheniramine-pseudoephedrine-DM 30-2-10 MG/5ML syrup Take 5 mLs by mouth 4 (four) times daily as needed. 02/13/18   Enid DerryWagner, Ashley, PA-C  cyclobenzaprine (FLEXERIL) 10 MG tablet Take 1 tablet (10 mg total) by mouth 3 (three) times daily as needed for muscle spasms. 12/12/16   Chinita Pesterriplett, Cari B, FNP  docusate sodium (COLACE) 100 MG capsule Take 1 tablet once or twice daily as needed for constipation while taking narcotic pain medicine 03/14/18   Loleta RoseForbach, Cory, MD  fluticasone [facility](FLONASE) 50 MCG/ACT nasal spray Place 2 sprays into both nostrils daily. 02/13/18 02/13/19  Enid DerryWagner, Ashley, PA-C  HYDROcodone-acetaminophen (NORCO/VICODIN) 5-325 MG tablet Take 1-2 tablets by mouth every 4 (four) hours as needed for moderate pain. 03/14/18   Loleta RoseForbach, Cory, MD  ibuprofen (ADVIL,MOTRIN) 600 MG tablet Take 1 tablet by mouth three times daily  with meals 03/14/18   Loleta RoseForbach, Cory, MD  meloxicam (MOBIC) 15 MG tablet Take 1 tablet (15 mg total) by mouth daily. 12/11/15   Triplett, Rulon Eisenmengerari B, FNP  naproxen (NAPROSYN) 500 MG tablet Take 1 tablet (500 mg total) by mouth 2 (two) times daily with a meal. 12/12/16   Triplett, Cari B, FNP  penicillin v potassium (VEETID) 500 MG tablet Take 1 tablet (500 mg total) by mouth 4 (four) times daily. 03/14/18   Loleta RoseForbach, Cory, MD  valACYclovir (VALTREX) 1000 MG tablet Take 2 tablets (2,000 mg total) by mouth every 12 (twelve) hours. Herpes labialis 05/06/15   Menshew, Charlesetta IvoryJenise V Bacon, PA-C    Allergies Patient has no known allergies.  No family history on file.  Social History Social History   Tobacco Use  . Smoking status: Heavy Tobacco Smoker    Packs/day: 1.00    Types: Cigarettes  . Smokeless tobacco: Never Used  Substance Use Topics  . Alcohol use: Yes  . Drug use: No     Review of Systems  Constitutional: No fever/chills Eyes: No visual changes. Cardiovascular: no chest pain. Respiratory: no cough. No SOB. Gastrointestinal: No abdominal pain.  No nausea, no vomiting.  Musculoskeletal: Negative for musculoskeletal pain. Skin: Positive for "bumps" to his back. Neurological: Negative for headaches, focal weakness or numbness. 10-point ROS otherwise negative.  ____________________________________________   PHYSICAL EXAM:  VITAL SIGNS: ED Triage Vitals  Enc Vitals Group     BP 06/02/18 1448 134/84     Pulse Rate 06/02/18 1448 76  Resp 06/02/18 1448 20     Temp 06/02/18 1449 98.3 F (36.8 C)     Temp Source 06/02/18 1449 Oral     SpO2 06/02/18 1448 100 %     Weight 06/02/18 1448 170 lb (77.1 kg)     Height 06/02/18 1448 5\' 11"  (1.803 m)     Head Circumference --      Peak Flow --      Pain Score 06/02/18 1448 0     Pain Loc --      Pain Edu? --      Excl. in GC? --      Constitutional: Alert and oriented. Well appearing and in no acute distress. Eyes: Conjunctivae  are normal. PERRL. EOMI. Head: Atraumatic.  Neck: No stridor.   Hematological/Lymphatic/Immunilogical: No cervical lymphadenopathy. Cardiovascular: Normal rate, regular rhythm. Normal S1 and S2.  Good peripheral circulation. Respiratory: Normal respiratory effort without tachypnea or retractions. Lungs CTAB. Good air entry to the bases with no decreased or absent breath sounds. Musculoskeletal: Full range of motion to all extremities. No gross deformities appreciated. Neurologic:  Normal speech and language. No gross focal neurologic deficits are appreciated.  Skin:  Skin is warm, dry and intact. No rash noted.  Visualization of patient's back reveals 3 areas of acne.  No signs of infection.  No cystic acne.  No other significant skin findings. Psychiatric: Mood and affect are normal. Speech and behavior are normal. Patient exhibits appropriate insight and judgement.   ____________________________________________   LABS (all labs ordered are listed, but only abnormal results are displayed)  Labs Reviewed - No data to display ____________________________________________  EKG   ____________________________________________  RADIOLOGY   No results found.  ____________________________________________    PROCEDURES  Procedure(s) performed:    Procedures    Medications - No data to display   ____________________________________________   INITIAL IMPRESSION / ASSESSMENT AND PLAN / ED COURSE  Pertinent labs & imaging results that were available during my care of the patient were reviewed by me and considered in my medical decision making (see chart for details).  Review of the [place] CSRS was performed in accordance of the [facility] prior to dispensing any controlled drugs.      Patient's diagnosis is consistent with acne of the back.  Patient presents emergency department concern of "bumps" to his back.  Patient does have a history of acne but is concerned that this may be a  rash or boils.  On exam, patient has 3 areas of noninfected, noncystic acne.  Patient is given options to include retinoids, benzoyl peroxide, topical clindamycin.  Patient states that he will either use his proactive or we discussed the use of an exfoliate soap.  Follow-up primary care as needed..  No prescriptions at this time.  Patient is given ED precautions to return to the ED for any worsening or new symptoms.     ____________________________________________  FINAL CLINICAL IMPRESSION(S) / ED DIAGNOSES  Final diagnoses:  Acne vulgaris      NEW MEDICATIONS STARTED DURING THIS VISIT:  ED Discharge Orders    None          This chart was dictated using voice recognition software/Dragon. Despite best efforts to proofread, errors can occur which can change the meaning. Any change was purely unintentional.    Racheal PatchesCuthriell, Jonathan D, PA-C 06/02/18 1526    Pershing ProudSchaevitz, Myra Rudeavid Matthew, MD 06/02/18 [phone]

## 2018-06-02 NOTE — ED Triage Notes (Signed)
Pt reports has bumps on his back that he wants checked out. Pt unsure of how many but states that he has a hx of oily skin and acne on his back so wants to be checked out.

## 2018-06-22 ENCOUNTER — Emergency Department
Admission: EM | Admit: 2018-06-22 | Discharge: 2018-06-22 | Disposition: A | Discharge status: Home / Self Care | Payer: Self-pay | Attending: Emergency Medicine | Admitting: Emergency Medicine

## 2018-06-22 ENCOUNTER — Emergency Department: Payer: Self-pay

## 2018-06-22 ENCOUNTER — Other Ambulatory Visit: Payer: Self-pay

## 2018-06-22 DIAGNOSIS — F17210 Nicotine dependence, cigarettes, uncomplicated: Secondary | ICD-10-CM | POA: Insufficient documentation

## 2018-06-22 DIAGNOSIS — R1084 Generalized abdominal pain: Secondary | ICD-10-CM | POA: Insufficient documentation

## 2018-06-22 LAB — URINALYSIS, COMPLETE (UACMP) WITH MICROSCOPIC
BILIRUBIN URINE: NEGATIVE
Bacteria, UA: NONE SEEN
GLUCOSE, UA: NEGATIVE mg/dL
Hgb urine dipstick: NEGATIVE
Ketones, ur: NEGATIVE mg/dL
Leukocytes, UA: NEGATIVE
Nitrite: NEGATIVE
Protein, ur: NEGATIVE mg/dL
Specific Gravity, Urine: 1.031 — ABNORMAL HIGH (ref 1.005–1.030)
Squamous Epithelial / HPF: NONE SEEN (ref 0–5)
pH: 5 (ref 5.0–8.0)

## 2018-06-22 LAB — COMPREHENSIVE METABOLIC PANEL
ALT: 22 U/L (ref 0–44)
ANION GAP: 6 (ref 5–15)
AST: 17 U/L (ref 15–41)
Albumin: 4.5 g/dL (ref 3.5–5.0)
Alkaline Phosphatase: 68 U/L (ref 38–126)
BUN: 14 mg/dL (ref 6–20)
CO2: 26 mmol/L (ref 22–32)
Calcium: 9.3 mg/dL (ref 8.9–10.3)
Chloride: 107 mmol/L (ref 98–111)
Creatinine, Ser: 1 mg/dL (ref 0.61–1.24)
GFR calc non Af Amer: >60 mL/min (ref >60)
Glucose, Bld: 102 mg/dL — ABNORMAL HIGH (ref 70–99)
Potassium: 3.3 mmol/L — ABNORMAL LOW (ref 3.5–5.1)
Sodium: 139 mmol/L (ref 135–145)
Total Bilirubin: 0.6 mg/dL (ref 0.3–1.2)
Total Protein: 7.4 g/dL (ref 6.5–8.1)

## 2018-06-22 LAB — CBC
HCT: 42.4 % (ref 39.0–52.0)
Hemoglobin: 14.3 g/dL (ref 13.0–17.0)
MCH: 31.4 pg (ref 26.0–34.0)
MCHC: 33.7 g/dL (ref 30.0–36.0)
MCV: 93.2 fL (ref 80.0–100.0)
Platelets: 282 10*3/uL (ref 150–400)
RBC: 4.55 MIL/uL (ref 4.22–5.81)
RDW: 12.7 % (ref 11.5–15.5)
WBC: 6.3 10*3/uL (ref 4.0–10.5)
nRBC: 0 % (ref 0.0–0.2)

## 2018-06-22 LAB — CHLAMYDIA/NGC RT PCR (ARMC ONLY)
Chlamydia Tr: NOT DETECTED
N gonorrhoeae: NOT DETECTED

## 2018-06-22 LAB — LIPASE, BLOOD: Lipase: 23 U/L (ref 11–51)

## 2018-06-22 MED ORDER — FAMOTIDINE 20 MG PO TABS: 20.0000 mg | ORAL_TABLET | Freq: Two times a day (BID) | ORAL | 1 refills | Status: DC

## 2018-06-22 NOTE — ED Provider Notes (Signed)
[facility] Emergency Department Provider Note       Time seen: ----------------------------------------- 10:14 AM on 06/22/2018 -----------------------------------------   I have reviewed the triage vital signs and the nursing notes.  HISTORY   Chief Complaint Abdominal Pain   HPI Catlin L Karilyn CotaLeath is a 28 y.o. male with no significant history who presents to the ED for centralized abdominal pain for the past day.  He denies fevers, chills, vomiting, diarrhea.  He denies any loss of appetite.  Patient is worried he has an STD.  He reports last time he had abdominal pain like this he had an STD.  He states he had a normal bowel movement this morning.  He is not having any urinary symptoms or genital lesions.  History reviewed. No pertinent past medical history.  There are no active problems to display for this patient.   History reviewed. No pertinent surgical history.  Allergies Patient has no known allergies.  Social History Social History   Tobacco Use  . Smoking status: Heavy Tobacco Smoker    Packs/day: 1.00    Types: Cigarettes  . Smokeless tobacco: Never Used  Substance Use Topics  . Alcohol use: Not Currently  . Drug use: No   Review of Systems Constitutional: Negative for fever. Cardiovascular: Negative for chest pain. Respiratory: Negative for shortness of breath. Gastrointestinal: Positive for abdominal pain Musculoskeletal: Negative for back pain. Skin: Negative for rash. Neurological: Negative for headaches, focal weakness or numbness.  All systems negative/normal/unremarkable except as stated in the HPI  ____________________________________________   PHYSICAL EXAM:  VITAL SIGNS: ED Triage Vitals  Enc Vitals Group     BP 06/22/18 1001 (!) 138/92     Pulse Rate 06/22/18 1001 (!) 117     Resp 06/22/18 1001 15     Temp 06/22/18 1001 98.2 F (36.8 C)     Temp Source 06/22/18 1001 Oral     SpO2 06/22/18 1001 100 %   Weight 06/22/18 1002 165 lb (74.8 kg)     Height 06/22/18 1002 5\' 11"  (1.803 m)     Head Circumference --      Peak Flow --      Pain Score 06/22/18 1002 9     Pain Loc --      Pain Edu? --      Excl. in GC? --    Constitutional: Alert and oriented. Well appearing and in no distress. Eyes: Conjunctivae are normal. Normal extraocular movements. Cardiovascular: Normal rate, regular rhythm. No murmurs, rubs, or gallops. Respiratory: Normal respiratory effort without tachypnea nor retractions. Breath sounds are clear and equal bilaterally. No wheezes/rales/rhonchi. Gastrointestinal: Soft and nontender. Normal bowel sounds Musculoskeletal: Nontender with normal range of motion in extremities. No lower extremity tenderness nor edema. Neurologic:  Normal speech and language. No gross focal neurologic deficits are appreciated.  Skin:  Skin is warm, dry and intact. No rash noted. Psychiatric: Mood and affect are normal. Speech and behavior are normal.   ____________________________________________  ED COURSE:  As part of my medical decision making, I reviewed the following data within the electronic MEDICAL RECORD NUMBER History obtained from family if available, nursing notes, old chart and ekg, as well as notes from prior ED visits. Patient presented for abdominal pain, we will assess with labs and imaging as indicated at this time.   Procedures ____________________________________________   LABS (pertinent positives/negatives)  Labs Reviewed  COMPREHENSIVE METABOLIC PANEL - Abnormal; Notable for the following components:      Result  Value   Potassium 3.3 (*)    Glucose, Bld 102 (*)    All other components within normal limits  URINALYSIS, COMPLETE (UACMP) WITH MICROSCOPIC - Abnormal; Notable for the following components:   Color, Urine YELLOW (*)    APPearance CLEAR (*)    Specific Gravity, Urine 1.031 (*)    All other components within normal limits  CHLAMYDIA/NGC RT PCR ([facility]  ONLY)  LIPASE, BLOOD  CBC  HIV ANTIBODY (ROUTINE TESTING W REFLEX)    RADIOLOGY Images were viewed by me  Abdomen 2 view IMPRESSION: No evidence of bowel obstruction or ileus. ____________________________________________   DIFFERENTIAL DIAGNOSIS   Gastritis, GERD, pancreatitis, gastroenteritis, obstruction, appendicitis, STD  FINAL ASSESSMENT AND PLAN  Abdominal pain   Plan: The patient had presented for centralized abdominal pain. Patient's labs are unremarkable, STD and HIV testing are pending. Patient's imaging revealed no acute process.  He is cleared for outpatient follow-up.   Ulice DashJohnathan E Merve Hotard, MD    Note: This note was generated in part or whole with voice recognition software. Voice recognition is usually quite accurate but there are transcription errors that can and very often do occur. I apologize for any typographical errors that were not detected and corrected.     Emily FilbertWilliams, Johnwesley Lederman E, MD 06/22/18 1214

## 2018-06-22 NOTE — ED Triage Notes (Signed)
Pt c/o centralized abd pain x1 day - denies N/V/D - denies loss of appetite

## 2018-06-23 LAB — HIV ANTIBODY (ROUTINE TESTING W REFLEX): HIV Screen 4th Generation wRfx: NONREACTIVE

## 2018-09-12 ENCOUNTER — Emergency Department: Payer: Self-pay

## 2018-09-12 ENCOUNTER — Other Ambulatory Visit: Payer: Self-pay

## 2018-09-12 ENCOUNTER — Emergency Department
Admission: EM | Admit: 2018-09-12 | Discharge: 2018-09-12 | Disposition: A | Discharge status: Home / Self Care | Payer: Self-pay | Attending: Emergency Medicine | Admitting: Emergency Medicine

## 2018-09-12 DIAGNOSIS — Z79899 Other long term (current) drug therapy: Secondary | ICD-10-CM | POA: Insufficient documentation

## 2018-09-12 DIAGNOSIS — M940 Chondrocostal junction syndrome [Tietze]: Secondary | ICD-10-CM | POA: Insufficient documentation

## 2018-09-12 DIAGNOSIS — F17210 Nicotine dependence, cigarettes, uncomplicated: Secondary | ICD-10-CM | POA: Insufficient documentation

## 2018-09-12 LAB — BASIC METABOLIC PANEL
Anion gap: 9 (ref 5–15)
BUN: 14 mg/dL (ref 6–20)
CO2: 23 mmol/L (ref 22–32)
Calcium: 9.1 mg/dL (ref 8.9–10.3)
Chloride: 109 mmol/L (ref 98–111)
Creatinine, Ser: 1.1 mg/dL (ref 0.61–1.24)
GFR calc Af Amer: >60 mL/min (ref >60)
GFR calc non Af Amer: >60 mL/min (ref >60)
Glucose, Bld: 90 mg/dL (ref 70–99)
POTASSIUM: 3.4 mmol/L — AB (ref 3.5–5.1)
Sodium: 141 mmol/L (ref 135–145)

## 2018-09-12 LAB — CBC
HCT: 43.1 % (ref 39.0–52.0)
Hemoglobin: 14.6 g/dL (ref 13.0–17.0)
MCH: 31.5 pg (ref 26.0–34.0)
MCHC: 33.9 g/dL (ref 30.0–36.0)
MCV: 93.1 fL (ref 80.0–100.0)
Platelets: 284 10*3/uL (ref 150–400)
RBC: 4.63 MIL/uL (ref 4.22–5.81)
RDW: 13 % (ref 11.5–15.5)
WBC: 5.3 10*3/uL (ref 4.0–10.5)
nRBC: 0 % (ref 0.0–0.2)

## 2018-09-12 LAB — TROPONIN I: Troponin I: <0.03 ng/mL (ref <0.03)

## 2018-09-12 NOTE — Discharge Instructions (Addendum)
Use Tylenol or Motrin for the pain.  Motrin you can take 4 of the over-the-counter pills 3 times a day with food for 3 or 4 days or you can take 3 of the over-the-counter pills 4 times a day with food for 3 or 4 days.  Return for increasing pain fever shortness of breath or feeling sicker.

## 2018-09-12 NOTE — ED Provider Notes (Signed)
[facility] Emergency Department Provider Note   ____________________________________________   First MD Initiated Contact with Patient 09/12/18 1156     (approximate)  I have reviewed the triage vital signs and the nursing notes.   HISTORY  Chief Complaint Shortness of Breath    HPI John Spence is a 28 y.o. male who reports about 3 days of pain into ribs in the left side of his chest anteriorly.  He can point to the area.  He says it makes him sore when he takes a deep breath and it that makes him a little short of breath.  He is not running a fever he is not coughing.  He is not having any other problems.  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         History reviewed. No pertinent past medical history.  There are no active problems to display for this patient.   History reviewed. No pertinent surgical history.  Prior to Admission medications   Medication Sig Start Date End Date Taking? Authorizing Provider  brompheniramine-pseudoephedrine-DM 30-2-10 MG/5ML syrup Take 5 mLs by mouth 4 (four) times daily as needed. 02/13/18   Enid Derry, PA-C  cyclobenzaprine (FLEXERIL) 10 MG tablet Take 1 tablet (10 mg total) by mouth 3 (three) times daily as needed for muscle spasms. 12/12/16   Kem Boroughs B, FNP  docusate sodium (COLACE) 100 MG capsule Take 1 tablet once or twice daily as needed for constipation while taking narcotic pain medicine 03/14/18   Loleta Rose, MD  famotidine (PEPCID) 20 MG tablet Take 1 tablet (20 mg total) by mouth 2 (two) times daily. 06/22/18   Emily Filbert, MD  fluticasone (FLONASE) 50 MCG/ACT nasal spray Place 2 sprays into both nostrils daily. 02/13/18 02/13/19  Enid Derry, PA-C  HYDROcodone-acetaminophen (NORCO/VICODIN) 5-325 MG tablet Take 1-2 tablets by mouth every 4 (four) hours as needed for moderate pain. 03/14/18   Loleta Rose, MD  ibuprofen (ADVIL,MOTRIN) 600 MG tablet  Take 1 tablet by mouth three times daily with meals 03/14/18   Loleta Rose, MD  meloxicam (MOBIC) 15 MG tablet Take 1 tablet (15 mg total) by mouth daily. 12/11/15   Triplett, Rulon Eisenmenger B, FNP  naproxen (NAPROSYN) 500 MG tablet Take 1 tablet (500 mg total) by mouth 2 (two) times daily with a meal. 12/12/16   Triplett, Cari B, FNP  penicillin v potassium (VEETID) 500 MG tablet Take 1 tablet (500 mg total) by mouth 4 (four) times daily. 03/14/18   Loleta Rose, MD  valACYclovir (VALTREX) 1000 MG tablet Take 2 tablets (2,000 mg total) by mouth every 12 (twelve) hours. Herpes labialis 05/06/15   Menshew, Charlesetta Ivory, PA-C    Allergies Patient has no known allergies.  History reviewed. No pertinent family history.  Social History Social History   Tobacco Use  . Smoking status: Heavy Tobacco Smoker    Packs/day: 1.00    Types: Cigarettes  . Smokeless tobacco: Never Used  Substance Use Topics  . Alcohol use: Not Currently  . Drug use: Yes    Types: Marijuana    Review of Systems  Constitutional: No fever/chills Eyes: No visual changes. ENT: No sore throat. Cardiovascular: See HPI Respiratory: The HPI Gastrointestinal: No abdominal pain.  No nausea, no vomiting.  No diarrhea.  No constipation. Genitourinary: Negative for dysuria. Musculoskeletal: Negative for back pain. Skin: Negative for rash. Neurological: Negative for headaches, focal weakness ____________________________________________   PHYSICAL EXAM:  VITAL SIGNS: ED Triage Vitals  Enc Vitals Group     BP 09/12/18 1139 (!) 139/99     Pulse Rate 09/12/18 1139 71     Resp 09/12/18 1139 16     Temp 09/12/18 1139 98 F (36.7 C)     Temp Source 09/12/18 1139 Oral     SpO2 09/12/18 1139 100 %     Weight 09/12/18 1140 170 lb (77.1 kg)     Height 09/12/18 1140 6' (1.829 m)     Head Circumference --      Peak Flow --      Pain Score 09/12/18 1140 6     Pain Loc --      Pain Edu? --      Excl. in GC? --      Constitutional: Alert and oriented. Well appearing and in no acute distress. Eyes: Conjunctivae are normal. Head: Atraumatic. Nose: No congestion/rhinnorhea. Mouth/Throat: Mucous membranes are moist.  Oropharynx non-erythematous. Neck: No stridor.   Cardiovascular: Normal rate, regular rhythm. Grossly normal heart sounds.  Good peripheral circulation. Respiratory: Normal respiratory effort.  No retractions. Lungs CTAB.  Chest is tender as described in HPI Gastrointestinal: Soft and nontender. No distention. No abdominal bruits. No CVA tenderness. Musculoskeletal: No lower extremity tenderness nor edema.  No joint effusions. Neurologic:  Normal speech and language. No gross focal neurologic deficits are appreciated.  Skin:  Skin is warm, dry and intact. No rash noted.   ____________________________________________   LABS (all labs ordered are listed, but only abnormal results are displayed)  Labs Reviewed  BASIC METABOLIC PANEL - Abnormal; Notable for the following components:      Result Value   Potassium 3.4 (*)    All other components within normal limits  CBC  TROPONIN I   ____________________________________________  EKG  EKG read and interpreted by me shows normal sinus rhythm rate of 64 normal axis essentially normal EKG ____________________________________________  RADIOLOGY  ED MD interpretation: X-ray read by radiology reviewed by me shows no acute disease  Official radiology report(s): Dg Chest Portable 1 View  Result Date: 09/12/2018 CLINICAL DATA:  Shortness of breath 3 days EXAM: PORTABLE CHEST 1 VIEW COMPARISON:  None. FINDINGS: The heart size and mediastinal contours are within normal limits. Both lungs are clear. The visualized skeletal structures are unremarkable. IMPRESSION: No active disease. Electronically Signed   By: Elige Ko   On: 09/12/2018 12:16    ____________________________________________   PROCEDURES  Procedure(s) performed  (including Critical Care):  Procedures   ____________________________________________   INITIAL IMPRESSION / ASSESSMENT AND PLAN / ED COURSE Patient does appear to have costochondritis.  I will treat him for this.  Return for fever worsening pain or shortness of breath.    John Spence was evaluated in Emergency Department on 09/12/2018 for the symptoms described in the history of present illness. He was evaluated in the context of the global COVID-19 pandemic, which necessitated consideration that the patient might be at risk for infection with the SARS-CoV-2 virus that causes COVID-19. Institutional protocols and algorithms that pertain to the evaluation of patients at risk for COVID-19 are in a state of rapid change based on information released by regulatory bodies including the CDC and federal and state organizations. These policies and algorithms were followed during the patient's care in the ED.          ____________________________________________   FINAL CLINICAL IMPRESSION(S) / ED DIAGNOSES  Final diagnoses:  Costochondritis     ED  Discharge Orders    None       Note:  This document was prepared using Dragon voice recognition software and may include unintentional dictation errors.    Arnaldo Natal, MD 09/12/18 [phone]

## 2018-09-12 NOTE — ED Triage Notes (Signed)
Pt arrives from home via [facility] for SOB x 3 days. States worse today. No distress noted. VSS with EMS. No cough or fever. Speaking in complete sentences. Arrives with IV L FA.

## 2018-09-12 NOTE — ED Notes (Signed)
EDP at bedside  

## 2018-12-19 ENCOUNTER — Encounter: Payer: Self-pay | Admitting: Emergency Medicine

## 2018-12-19 ENCOUNTER — Emergency Department
Admission: EM | Admit: 2018-12-19 | Discharge: 2018-12-19 | Disposition: A | Discharge status: Home / Self Care | Payer: Self-pay | Attending: Student in an Organized Health Care Education/Training Program | Admitting: Student in an Organized Health Care Education/Training Program

## 2018-12-19 ENCOUNTER — Emergency Department: Payer: Self-pay

## 2018-12-19 ENCOUNTER — Other Ambulatory Visit: Payer: Self-pay

## 2018-12-19 DIAGNOSIS — F17210 Nicotine dependence, cigarettes, uncomplicated: Secondary | ICD-10-CM | POA: Insufficient documentation

## 2018-12-19 DIAGNOSIS — Y998 Other external cause status: Secondary | ICD-10-CM | POA: Insufficient documentation

## 2018-12-19 DIAGNOSIS — Y9389 Activity, other specified: Secondary | ICD-10-CM | POA: Insufficient documentation

## 2018-12-19 DIAGNOSIS — F1210 Cannabis abuse, uncomplicated: Secondary | ICD-10-CM | POA: Insufficient documentation

## 2018-12-19 DIAGNOSIS — Z79899 Other long term (current) drug therapy: Secondary | ICD-10-CM | POA: Insufficient documentation

## 2018-12-19 DIAGNOSIS — S93402A Sprain of unspecified ligament of left ankle, initial encounter: Secondary | ICD-10-CM | POA: Insufficient documentation

## 2018-12-19 DIAGNOSIS — Y929 Unspecified place or not applicable: Secondary | ICD-10-CM | POA: Insufficient documentation

## 2018-12-19 DIAGNOSIS — X509XXA Other and unspecified overexertion or strenuous movements or postures, initial encounter: Secondary | ICD-10-CM | POA: Insufficient documentation

## 2018-12-19 MED ORDER — NAPROXEN 500 MG PO TABS: 500.0000 mg | ORAL_TABLET | Freq: Two times a day (BID) | ORAL | Status: DC

## 2018-12-19 NOTE — ED Provider Notes (Addendum)
[facility] Emergency Department Provider Note   ____________________________________________   First MD Initiated Contact with Patient 12/19/18 1217     (approximate)  I have reviewed the triage vital signs and the nursing notes.   HISTORY  Chief Complaint Leg Pain    HPI Ed L Bevens is a 28 y.o. male patient complain left medial ankle pain secondary to a twisting incident 4 days ago.  Patient states his ankle while dancing.  Patient job requires prolonged standing he believes is aggravating and slowing his healing process.  Patient rates pain as a 7/10.  Patient's chronic pain is "achy".  No palliative measure for complaint.         History reviewed. No pertinent past medical history.  There are no active problems to display for this patient.   History reviewed. No pertinent surgical history.  Prior to Admission medications   Medication Sig Start Date End Date Taking? Authorizing Provider  brompheniramine-pseudoephedrine-DM 30-2-10 MG/5ML syrup Take 5 mLs by mouth 4 (four) times daily as needed. 02/13/18   Laban Emperor, PA-C  cyclobenzaprine (FLEXERIL) 10 MG tablet Take 1 tablet (10 mg total) by mouth 3 (three) times daily as needed for muscle spasms. 12/12/16   Sherrie George B, FNP  docusate sodium (COLACE) 100 MG capsule Take 1 tablet once or twice daily as needed for constipation while taking narcotic pain medicine 03/14/18   Hinda Kehr, MD  famotidine (PEPCID) 20 MG tablet Take 1 tablet (20 mg total) by mouth 2 (two) times daily. 06/22/18   Earleen Newport, MD  fluticasone (FLONASE) 50 MCG/ACT nasal spray Place 2 sprays into both nostrils daily. 02/13/18 02/13/19  Laban Emperor, PA-C  HYDROcodone-acetaminophen (NORCO/VICODIN) 5-325 MG tablet Take 1-2 tablets by mouth every 4 (four) hours as needed for moderate pain. 03/14/18   Hinda Kehr, MD  ibuprofen (ADVIL,MOTRIN) 600 MG tablet Take 1 tablet by mouth three times daily with meals 03/14/18    Hinda Kehr, MD  meloxicam (MOBIC) 15 MG tablet Take 1 tablet (15 mg total) by mouth daily. 12/11/15   Triplett, Johnette Abraham B, FNP  naproxen (NAPROSYN) 500 MG tablet Take 1 tablet (500 mg total) by mouth 2 (two) times daily with a meal. 12/12/16   Triplett, Cari B, FNP  naproxen (NAPROSYN) 500 MG tablet Take 1 tablet (500 mg total) by mouth 2 (two) times daily with a meal. 12/19/18   Sable Feil, PA-C  penicillin v potassium (VEETID) 500 MG tablet Take 1 tablet (500 mg total) by mouth 4 (four) times daily. 03/14/18   Hinda Kehr, MD  valACYclovir (VALTREX) 1000 MG tablet Take 2 tablets (2,000 mg total) by mouth every 12 (twelve) hours. Herpes labialis 05/06/15   Menshew, Dannielle Karvonen, PA-C    Allergies Patient has no known allergies.  No family history on file.  Social History Social History   Tobacco Use  . Smoking status: Heavy Tobacco Smoker    Packs/day: 1.00    Types: Cigarettes  . Smokeless tobacco: Never Used  Substance Use Topics  . Alcohol use: Not Currently  . Drug use: Yes    Types: Marijuana    Review of Systems  Constitutional: No fever/chills Eyes: No visual changes. ENT: No sore throat. Cardiovascular: Denies chest pain. Respiratory: Denies shortness of breath. Gastrointestinal: No abdominal pain.  No nausea, no vomiting.  No diarrhea.  No constipation. Genitourinary: Negative for dysuria. Musculoskeletal: Left ankle pain.   Skin: Negative for rash. Neurological: Negative for headaches, focal weakness  or numbness.   ____________________________________________   PHYSICAL EXAM:  VITAL SIGNS: ED Triage Vitals [12/19/18 1200]  Enc Vitals Group     BP 130/85     Pulse Rate 74     Resp 16     Temp 99 F (37.2 C)     Temp Source Oral     SpO2 95 %     Weight 160 lb (72.6 kg)     Height 5\' 11"  (1.803 m)     Head Circumference      Peak Flow      Pain Score 7     Pain Loc      Pain Edu?      Excl. in GC?     Constitutional: Alert and oriented.  Well appearing and in no acute distress. Cardiovascular: Normal rate, regular rhythm. Grossly normal heart sounds.  Good peripheral circulation. Respiratory: Normal respiratory effort.  No retractions. Lungs CTAB. Musculoskeletal: No obvious deformity or edema to the left ankle.  Patient has moderate guarding palpation medial aspect of the ankle.  Patient has full neck range of motion.  Neurologic:  Normal speech and language. No gross focal neurologic deficits are appreciated. No gait instability. Skin:  Skin is warm, dry and intact. No rash noted. Psychiatric: Mood and affect are normal. Speech and behavior are normal.  ____________________________________________   LABS (all labs ordered are listed, but only abnormal results are displayed)  Labs Reviewed - No data to display ____________________________________________  EKG   ____________________________________________  RADIOLOGY  ED MD interpretation:    Official radiology report(s): Dg Ankle Complete Left  Result Date: 12/19/2018 CLINICAL DATA:  LEFT ankle and leg pain, injured while dancing, twist injury 4 days ago EXAM: LEFT ANKLE COMPLETE - 3+ VIEW COMPARISON:  None FINDINGS: Osseous mineralization normal. Joint spaces preserved. No acute fracture, dislocation, or bone destruction. IMPRESSION: No acute osseous abnormalities. Electronically Signed   By: Ulyses SouthwardMark  Boles M.D.   On: 12/19/2018 12:47    ____________________________________________   PROCEDURES  Procedure(s) performed (including Critical Care):  .Splint Application  Date/Time: 12/19/2018 12:59 PM Performed by: Joni ReiningSmith, Ronald K, PA-C Authorized by: Joni ReiningSmith, Ronald K, PA-C   Consent:    Consent obtained:  Verbal   Consent given by:  Patient   Risks discussed:  Swelling Pre-procedure details:    Sensation:  Normal Procedure details:    Laterality:  Left   Location:  Ankle   Ankle:  L ankle   Strapping: no     Supplies:  Prefabricated splint Post-procedure  details:    Pain:  Unchanged   Sensation:  Normal   Patient tolerance of procedure:  Tolerated well, no immediate complications     ____________________________________________   INITIAL IMPRESSION / ASSESSMENT AND PLAN / ED COURSE  As part of my medical decision making, I reviewed the following data within the electronic MEDICAL RECORD NUMBER         Marcelyn BruinsDontae L Roddey was evaluated in Emergency Department on 12/19/2018 for the symptoms described in the history of present illness. He was evaluated in the context of the global COVID-19 pandemic, which necessitated consideration that the patient might be at risk for infection with the SARS-CoV-2 virus that causes COVID-19. Institutional protocols and algorithms that pertain to the evaluation of patients at risk for COVID-19 are in a state of rapid change based on information released by regulatory bodies including the CDC and federal and state organizations. These policies and algorithms were followed during the patient's care in  the ED.    Patient presents with left ankle pain secondary to sprain.  Discussed neck x-ray findings with patient.  Patient placed in ankle splint given discharge care instructions.  [facility]   ____________________________________________   FINAL CLINICAL IMPRESSION(S) / ED DIAGNOSES  Final diagnoses:  Moderate left ankle sprain, initial encounter     ED Discharge Orders         Ordered    naproxen (NAPROSYN) 500 MG tablet  2 times daily with meals     12/19/18 1256           Note:  This document was prepared using Dragon voice recognition software and may include unintentional dictation errors.    Joni ReiningSmith, Ronald K, PA-C 12/19/18 1258    Joni ReiningSmith, Ronald K, PA-C 12/19/18 1300    Willy Eddyobinson, Patrick, MD 12/19/18 1414

## 2018-12-19 NOTE — ED Triage Notes (Signed)
Patient presents to the ED with left leg pain that patient injured while dancing.  Patient is ambulatory to triage with no obvious distress.

## 2019-03-02 ENCOUNTER — Other Ambulatory Visit: Payer: Self-pay

## 2019-03-02 ENCOUNTER — Emergency Department
Admission: EM | Admit: 2019-03-02 | Discharge: 2019-03-02 | Disposition: A | Discharge status: Home / Self Care | Payer: Self-pay | Attending: Emergency Medicine | Admitting: Emergency Medicine

## 2019-03-02 DIAGNOSIS — Z20828 Contact with and (suspected) exposure to other viral communicable diseases: Secondary | ICD-10-CM | POA: Insufficient documentation

## 2019-03-02 DIAGNOSIS — F17210 Nicotine dependence, cigarettes, uncomplicated: Secondary | ICD-10-CM | POA: Insufficient documentation

## 2019-03-02 DIAGNOSIS — J029 Acute pharyngitis, unspecified: Secondary | ICD-10-CM | POA: Insufficient documentation

## 2019-03-02 LAB — GROUP A STREP BY PCR: Group A Strep by PCR: NOT DETECTED

## 2019-03-02 NOTE — ED Triage Notes (Signed)
Cough and aching.  No fever.  No sick contacts or travel.  Called ems to get checked out .

## 2019-03-02 NOTE — ED Triage Notes (Signed)
Pt comes into the ED via EMS from home with sore throat, bodyaches and cough since yesterday. Pt is in NAD at present.

## 2019-03-02 NOTE — Discharge Instructions (Addendum)
Begin taking Tylenol or ibuprofen as needed for sore throat and increase fluids.  Your strep test today was negative.  A cover test is being done today.  You should quarantine for the next 48 hours until you have received the results of your test.  Should your test result as positive you should continue to quarantine for an extra 10 days.

## 2019-03-02 NOTE — ED Notes (Signed)
See triage note  Presents with sore throat,body aches and cough  States sx's started yesterday denies any feer

## 2019-03-02 NOTE — ED Provider Notes (Signed)
[facility] Emergency Department Provider Note  ____________________________________________   First MD Initiated Contact with Patient 03/02/19 1225     (approximate)  I have reviewed the triage vital signs and the nursing notes.   HISTORY  Chief Complaint URI   HPI John Spence is a 28 y.o. male presents to the ED via EMS from home with complaint of sore throat, body aches and cough since yesterday.  Patient denies any fever.  He denies any GI symptoms.  He denies any known COVID exposure and has not traveled.  He rates his throat pain as an 8 out of 10.      History reviewed. No pertinent past medical history.  There are no active problems to display for this patient.   History reviewed. No pertinent surgical history.  Prior to Admission medications   Medication Sig Start Date End Date Taking? Authorizing Provider  fluticasone (FLONASE) 50 MCG/ACT nasal spray Place 2 sprays into both nostrils daily. 02/13/18 02/13/19  Laban Emperor, PA-C    Allergies Patient has no known allergies.  No family history on file.  Social History Social History   Tobacco Use  . Smoking status: Heavy Tobacco Smoker    Packs/day: 1.00    Types: Cigarettes  . Smokeless tobacco: Never Used  Substance Use Topics  . Alcohol use: Not Currently  . Drug use: Yes    Types: Marijuana    Review of Systems Constitutional: No fever/chills Eyes: No visual changes. ENT: Positive sore throat. Cardiovascular: Denies chest pain. Respiratory: Denies shortness of breath.  Positive for cough. Gastrointestinal: No abdominal pain.  No nausea, no vomiting.  No diarrhea.  No constipation. Genitourinary: Negative for dysuria. Musculoskeletal: Positive for muscle aches. Skin: Negative for rash. Neurological: Negative for headaches, focal weakness or numbness. ___________________________________________   PHYSICAL EXAM:  VITAL SIGNS: ED Triage Vitals  Enc Vitals Group    BP 03/02/19 1209 122/70     Pulse Rate 03/02/19 1209 60     Resp 03/02/19 1209 18     Temp 03/02/19 1209 99.2 F (37.3 C)     Temp Source 03/02/19 1209 Oral     SpO2 03/02/19 1209 100 %     Weight 03/02/19 1216 170 lb (77.1 kg)     Height 03/02/19 1216 6' (1.829 m)     Head Circumference --      Peak Flow --      Pain Score 03/02/19 1216 8     Pain Loc --      Pain Edu? --      Excl. in [place]? --    Constitutional: Alert and oriented. Well appearing and in no acute distress. Eyes: Conjunctivae are normal.  Head: Atraumatic. Nose: No congestion/rhinnorhea. Mouth/Throat: Mucous membranes are moist.  Oropharynx non-erythematous.  No tonsillar exudate and uvula is midline.  Patient is able to talk in complete sentences without any difficulty. Neck: No stridor.   Hematological/Lymphatic/Immunilogical: No cervical lymphadenopathy. Cardiovascular: Normal rate, regular rhythm. Grossly normal heart sounds.  Good peripheral circulation. Respiratory: Normal respiratory effort.  No retractions. Lungs CTAB. Gastrointestinal: Soft and nontender. No distention.  Musculoskeletal: Moves upper and lower extremities with any difficulty.  Normal gait was noted. Neurologic:  Normal speech and language. No gross focal neurologic deficits are appreciated. No gait instability. Skin:  Skin is warm, dry and intact. No rash noted. Psychiatric: Mood and affect are normal. Speech and behavior are normal.  ____________________________________________   LABS (all labs ordered are listed, but  only abnormal results are displayed)  Labs Reviewed  GROUP A STREP BY PCR  SARS CORONAVIRUS 2 (TAT 6-24 HRS)    PROCEDURES  Procedure(s) performed (including Critical Care):  Procedures   ____________________________________________   INITIAL IMPRESSION / ASSESSMENT AND PLAN / ED COURSE  As part of my medical decision making, I reviewed the following data within the electronic MEDICAL RECORD NUMBER Notes from  prior ED visits and [place] Controlled Substance Database    John Spence was evaluated in Emergency Department on 03/02/2019 for the symptoms described in the history of present illness. He was evaluated in the context of the global COVID-19 pandemic, which necessitated consideration that the patient might be at risk for infection with the SARS-CoV-2 virus that causes COVID-19. Institutional protocols and algorithms that pertain to the evaluation of patients at risk for COVID-19 are in a state of rapid change based on information released by regulatory bodies including the CDC and federal and state organizations. These policies and algorithms were followed during the patient's care in the ED.  28 year old male presents to the ED via EMS with complaint of 1 day sore throat, body aches, cough.  He denies any known exposure to COVID.  He states the only symptom that is concerning for him is a sore throat.  Physical exam was unremarkable.  Strep test was negative.  Prior to his discharge patient did agree to have a COVID test as he had a low-grade temp.  He is aware that he needs to quarantine until he receives the results of his test. ____________________________________________   FINAL CLINICAL IMPRESSION(S) / ED DIAGNOSES  Final diagnoses:  Acute pharyngitis, unspecified etiology     ED Discharge Orders    None       Note:  This document was prepared using Dragon voice recognition software and may include unintentional dictation errors.    Tommi RumpsSummers,  L, PA-C 03/02/19 1456    Sharman CheekStafford, Phillip, MD 03/04/19 [phone]

## 2019-03-03 ENCOUNTER — Telehealth: Payer: Self-pay | Admitting: *Deleted

## 2019-03-03 LAB — SARS CORONAVIRUS 2 (TAT 6-24 HRS): SARS Coronavirus 2: NEGATIVE

## 2019-03-03 NOTE — Telephone Encounter (Signed)
Pt given result of COVID test; he verbalized understanding; unable to chart in result note because no encounter created.

## 2019-06-13 ENCOUNTER — Encounter: Payer: Self-pay | Admitting: Emergency Medicine

## 2019-06-13 ENCOUNTER — Other Ambulatory Visit: Payer: Self-pay

## 2019-06-13 ENCOUNTER — Emergency Department
Admission: EM | Admit: 2019-06-13 | Discharge: 2019-06-14 | Disposition: A | Discharge status: Home / Self Care | Payer: Self-pay | Attending: Emergency Medicine | Admitting: Emergency Medicine

## 2019-06-13 DIAGNOSIS — F17210 Nicotine dependence, cigarettes, uncomplicated: Secondary | ICD-10-CM | POA: Insufficient documentation

## 2019-06-13 DIAGNOSIS — K120 Recurrent oral aphthae: Secondary | ICD-10-CM

## 2019-06-13 MED ORDER — LIDOCAINE VISCOUS HCL 2 % MT SOLN: 15.0000 mL | OROMUCOSAL | 0 refills | Status: DC | PRN

## 2019-06-13 NOTE — ED Triage Notes (Signed)
Bump to left anterior tongue x 1 week.

## 2019-06-13 NOTE — ED Notes (Signed)
Pt seen in [facility] by [facility].  Discharged from [facility].

## 2019-06-13 NOTE — ED Provider Notes (Signed)
[facility] Emergency Department Provider Note ____________________________________________   First MD Initiated Contact with Patient 06/13/19 1809     (approximate)  I have reviewed the triage vital signs and the nursing notes.   HISTORY  Chief Complaint Dental Pain  HPI John Spence is a 28 y.o. male presents to the emergency department for evaluation of lesion to his tongue present for the past week.  He denies previous lesions similar to this.  He states that the area is no longer painful but has noticed another lesion coming up on the opposite side this time over the past day or so.  He states that he started reading on the Internet that this may be an early sign of HIV.  He denies any known exposure to HIV.  He states that he has been tested this year for STDs and HIV and was negative.  He states that he is just panicking because of what he read online.  He denies other symptoms such as fever, body aches, chills.  No alleviating measures attempted.         History reviewed. No pertinent past medical history.  There are no problems to display for this patient.   History reviewed. No pertinent surgical history.  Prior to Admission medications   Medication Sig Start Date End Date Taking? Authorizing Provider  fluticasone (FLONASE) 50 MCG/ACT nasal spray Place 2 sprays into both nostrils daily. 02/13/18 02/13/19  Enid Derry, PA-C  lidocaine (XYLOCAINE) 2 % solution Use as directed 15 mLs in the mouth or throat as needed for mouth pain. 06/13/19   Chinita Pester, FNP    Allergies Patient has no known allergies.  No family history on file.  Social History Social History   Tobacco Use  . Smoking status: Heavy Tobacco Smoker    Packs/day: 1.00    Types: Cigarettes  . Smokeless tobacco: Never Used  Substance Use Topics  . Alcohol use: Not Currently  . Drug use: Yes    Types: Marijuana    Review of Systems  Constitutional: No  fever/chills Eyes: No visual changes. ENT: No sore throat.  Positive for ulcerations on tongue. Cardiovascular: Denies chest pain. Respiratory: Denies shortness of breath. Gastrointestinal: No abdominal pain.  No nausea, no vomiting.  No diarrhea.  No constipation. Genitourinary: Negative for dysuria. Musculoskeletal: Negative for back pain. Skin: Negative for rash. Neurological: Negative for headaches, focal weakness or numbness.  ____________________________________________   PHYSICAL EXAM:  VITAL SIGNS: ED Triage Vitals  Enc Vitals Group     BP 06/13/19 1701 (!) 159/87     Pulse Rate 06/13/19 1701 98     Resp 06/13/19 1701 16     Temp 06/13/19 1701 98.3 F (36.8 C)     Temp Source 06/13/19 1701 Oral     SpO2 06/13/19 1701 100 %     Weight 06/13/19 1659 169 lb 15.6 oz (77.1 kg)     Height --      Head Circumference --      Peak Flow --      Pain Score 06/13/19 1659 0     Pain Loc --      Pain Edu? --      Excl. in GC? --     Constitutional: Alert and oriented. Well appearing and in no acute distress. Eyes: Conjunctivae are normal. PERRL. EOMI. Head: Atraumatic. Nose: No congestion/rhinnorhea. Mouth/Throat: Mucous membranes are moist.  Oropharynx non-erythematous. Neck: No stridor.   Hematological/Lymphatic/Immunilogical: No cervical lymphadenopathy.  Cardiovascular: Normal rate, regular rhythm. Grossly normal heart sounds.  Good peripheral circulation. Respiratory: Normal respiratory effort.  No retractions. Lungs CTAB. Gastrointestinal: Soft and nontender. No distention. No abdominal bruits. No CVA tenderness. Genitourinary:  Musculoskeletal: No lower extremity tenderness nor edema.  No joint effusions. Neurologic:  Normal speech and language. No gross focal neurologic deficits are appreciated. No gait instability. Skin: Small ulceration noted to the left and right side of the tongue.  No lichen planus or thrush noted in the mouth. Psychiatric: Mood and affect are  normal. Speech and behavior are normal.  ____________________________________________   LABS (all labs ordered are listed, but only abnormal results are displayed)  Labs Reviewed - No data to display ____________________________________________  EKG  Not indicated ____________________________________________  RADIOLOGY  ED MD interpretation:    Not indicated  Official radiology report(s): No results found.  ____________________________________________   PROCEDURES  Procedure(s) performed (including Critical Care):  Procedures  ____________________________________________   INITIAL IMPRESSION / ASSESSMENT AND PLAN     28 year old male presenting to the emergency department for evaluation of lesions on his tongue.  See HPI for further details.  DIFFERENTIAL DIAGNOSIS  Aphthous ulcers/ HSV 1, HSV-2  ED COURSE  Reassurance provided to the patient.  He was advised that if he would like to have another screening done, that he should go to the health department.  He will be given a prescription for lidocaine to help with pain if the new lesion is preventing him from being able to eat.  Otherwise, he was advised to go to the health department if he still has concerns regarding STD or HIV.  He currently denies any dysuria, penile discharge, or penile lesions.  ____________________________________________   FINAL CLINICAL IMPRESSION(S) / ED DIAGNOSES  Final diagnoses:  Aphthous ulcer of tongue     ED Discharge Orders         Ordered    lidocaine (XYLOCAINE) 2 % solution  As needed     06/13/19 1821           Note:  This document was prepared using Dragon voice recognition software and may include unintentional dictation errors.   Victorino Dike, FNP 06/13/19 2310    Harvest Dark, MD 06/14/19 [phone]

## 2019-07-17 ENCOUNTER — Encounter: Payer: Self-pay | Admitting: Emergency Medicine

## 2019-07-17 ENCOUNTER — Emergency Department
Admission: EM | Admit: 2019-07-17 | Discharge: 2019-07-17 | Disposition: A | Discharge status: Home / Self Care | Payer: Self-pay | Attending: Emergency Medicine | Admitting: Emergency Medicine

## 2019-07-17 ENCOUNTER — Other Ambulatory Visit: Payer: Self-pay

## 2019-07-17 DIAGNOSIS — Z113 Encounter for screening for infections with a predominantly sexual mode of transmission: Secondary | ICD-10-CM | POA: Insufficient documentation

## 2019-07-17 DIAGNOSIS — F17210 Nicotine dependence, cigarettes, uncomplicated: Secondary | ICD-10-CM | POA: Insufficient documentation

## 2019-07-17 DIAGNOSIS — K029 Dental caries, unspecified: Secondary | ICD-10-CM

## 2019-07-17 DIAGNOSIS — R109 Unspecified abdominal pain: Secondary | ICD-10-CM | POA: Insufficient documentation

## 2019-07-17 DIAGNOSIS — K0889 Other specified disorders of teeth and supporting structures: Secondary | ICD-10-CM | POA: Insufficient documentation

## 2019-07-17 LAB — COMPREHENSIVE METABOLIC PANEL
ALT: 24 U/L (ref 0–44)
AST: 16 U/L (ref 15–41)
Albumin: 4.4 g/dL (ref 3.5–5.0)
Alkaline Phosphatase: 65 U/L (ref 38–126)
Anion gap: 10 (ref 5–15)
BUN: 12 mg/dL (ref 6–20)
CO2: 24 mmol/L (ref 22–32)
Calcium: 9.1 mg/dL (ref 8.9–10.3)
Chloride: 106 mmol/L (ref 98–111)
Creatinine, Ser: 0.96 mg/dL (ref 0.61–1.24)
GFR calc Af Amer: >60 mL/min (ref >60)
GFR calc non Af Amer: >60 mL/min (ref >60)
Glucose, Bld: 98 mg/dL (ref 70–99)
Potassium: 3.5 mmol/L (ref 3.5–5.1)
Sodium: 140 mmol/L (ref 135–145)
Total Bilirubin: 0.7 mg/dL (ref 0.3–1.2)
Total Protein: 7.5 g/dL (ref 6.5–8.1)

## 2019-07-17 LAB — LIPASE, BLOOD: Lipase: 22 U/L (ref 11–51)

## 2019-07-17 LAB — URINALYSIS, COMPLETE (UACMP) WITH MICROSCOPIC
Bacteria, UA: NONE SEEN
Bilirubin Urine: NEGATIVE
Glucose, UA: NEGATIVE mg/dL
Hgb urine dipstick: NEGATIVE
Ketones, ur: NEGATIVE mg/dL
Leukocytes,Ua: NEGATIVE
Nitrite: NEGATIVE
Protein, ur: NEGATIVE mg/dL
Specific Gravity, Urine: 1.024 (ref 1.005–1.030)
Squamous Epithelial / HPF: NONE SEEN (ref 0–5)
pH: 5 (ref 5.0–8.0)

## 2019-07-17 LAB — CBC
HCT: 44.4 % (ref 39.0–52.0)
Hemoglobin: 14.9 g/dL (ref 13.0–17.0)
MCH: 31.7 pg (ref 26.0–34.0)
MCHC: 33.6 g/dL (ref 30.0–36.0)
MCV: 94.5 fL (ref 80.0–100.0)
Platelets: 260 10*3/uL (ref 150–400)
RBC: 4.7 MIL/uL (ref 4.22–5.81)
RDW: 12.9 % (ref 11.5–15.5)
WBC: 9.9 10*3/uL (ref 4.0–10.5)
nRBC: 0 % (ref 0.0–0.2)

## 2019-07-17 MED ORDER — PENICILLIN V POTASSIUM 500 MG PO TABS: 500.0000 mg | ORAL_TABLET | Freq: Four times a day (QID) | ORAL | 0 refills | Status: DC

## 2019-07-17 NOTE — ED Notes (Signed)
EDP, Paduchowski to bedside. 

## 2019-07-17 NOTE — ED Provider Notes (Signed)
[facility] Emergency Department Provider Note  Time seen: 4:04 PM  I have reviewed the triage vital signs and the nursing notes.   HISTORY  Chief Complaint Abdominal Pain   HPI John Spence is a 29 y.o. male with no significant past medical history presents to the emergency department for abdominal discomfort, wisdom tooth pain, STD check.  According to the patient for the past several days he has been experiencing some intermittent abdominal discomfort, feels like he needs to have a bowel movement per patient.  Denies any fever nausea vomiting or diarrhea.  Patient also states recent unprotected sex wishes to have an STD test as well, denies any penile discharge or dysuria.  Patient also states he has been experiencing pain in his bilateral lower wisdom teeth, steady spoke to a dentist who states he would need a round of penicillin before they be willing to pull the teeth per patient.   History reviewed. No pertinent past medical history.  There are no problems to display for this patient.   History reviewed. No pertinent surgical history.  Prior to Admission medications   Medication Sig Start Date End Date Taking? Authorizing Provider  fluticasone (FLONASE) 50 MCG/ACT nasal spray Place 2 sprays into both nostrils daily. 02/13/18 02/13/19  Laban Emperor, PA-C  lidocaine (XYLOCAINE) 2 % solution Use as directed 15 mLs in the mouth or throat as needed for mouth pain. 06/13/19   Triplett, Johnette Abraham B, FNP    No Known Allergies  History reviewed. No pertinent family history.  Social History Social History   Tobacco Use  . Smoking status: Heavy Tobacco Smoker    Packs/day: 1.00    Types: Cigarettes  . Smokeless tobacco: Never Used  Substance Use Topics  . Alcohol use: Not Currently  . Drug use: Yes    Types: Marijuana    Review of Systems Constitutional: Negative for fever. ENT: Wisdom tooth pain Cardiovascular: Negative for chest pain. Respiratory:  Negative for shortness of breath. Gastrointestinal: Mild abdominal discomfort.  Negative nausea vomiting or diarrhea. Genitourinary: Negative for urinary compaints Musculoskeletal: Negative for musculoskeletal complaints Neurological: Negative for headache All other ROS negative  ____________________________________________   PHYSICAL EXAM:  VITAL SIGNS: ED Triage Vitals  Enc Vitals Group     BP 07/17/19 1536 126/84     Pulse Rate 07/17/19 1536 86     Resp 07/17/19 1536 20     Temp 07/17/19 1536 98.6 F (37 C)     Temp Source 07/17/19 1536 Oral     SpO2 07/17/19 1536 99 %     Weight 07/17/19 1536 170 lb (77.1 kg)     Height 07/17/19 1536 6' (1.829 m)     Head Circumference --      Peak Flow --      Pain Score 07/17/19 1539 9     Pain Loc --      Pain Edu? --      Excl. in [place]? --    Constitutional: Alert and oriented. Well appearing and in no distress. Eyes: Normal exam ENT      Head: Normocephalic and atraumatic.      Mouth/Throat: Mucous membranes are moist. Cardiovascular: Normal rate, regular rhythm. No murmur Respiratory: Normal respiratory effort without tachypnea nor retractions. Breath sounds are clear  Gastrointestinal: Soft and nontender. No distention.  Musculoskeletal: Nontender with normal range of motion in all extremities. Neurologic:  Normal speech and language. No gross focal neurologic deficits Skin:  Skin is warm, dry  and intact.  Psychiatric: Mood and affect are normal.     INITIAL IMPRESSION / ASSESSMENT AND PLAN / ED COURSE  Pertinent labs & imaging results that were available during my care of the patient were reviewed by me and considered in my medical decision making (see chart for details).   Patient presents emergency department for intermittent abdominal discomfort, denies any abdominal pain currently benign abdominal exam.  Patient also wishes to have an STD test which we will send for the patient but denies any penile discharge or  dysuria.  Patient does have dental decay of his bilateral lower wisdom teeth we will write for 10 days of penicillin for the patient.  He states he will call his dentist tomorrow.  Overall the patient appears well with an overall reassuring physical exam.  Lab work is pending at this time.  Patient's work-up is reassuring including normal labs and urinalysis.  STD test has been sent.  We will discharge patient home with PCP follow-up.  Patient agreeable to plan of care.  John Spence was evaluated in Emergency Department on 07/17/2019 for the symptoms described in the history of present illness. He was evaluated in the context of the global COVID-19 pandemic, which necessitated consideration that the patient might be at risk for infection with the SARS-CoV-2 virus that causes COVID-19. Institutional protocols and algorithms that pertain to the evaluation of patients at risk for COVID-19 are in a state of rapid change based on information released by regulatory bodies including the CDC and federal and state organizations. These policies and algorithms were followed during the patient's care in the ED.  ____________________________________________   FINAL CLINICAL IMPRESSION(S) / ED DIAGNOSES  Abdominal pain Dental pain STD test   Minna Antis, MD 07/17/19 1642

## 2019-07-17 NOTE — ED Notes (Signed)
Pt ambulatory to bathroom independently

## 2019-07-17 NOTE — ED Triage Notes (Signed)
Pt here for lower mid abdominal pain.  Denies NVD or fevers.  No dysuria or discharge. Has been having unprotected sex.  VSS. Unlabored.  NAD.  Sx X 3 days.

## 2019-07-19 LAB — GC/CHLAMYDIA PROBE AMP
Chlamydia trachomatis, NAA: NEGATIVE
Neisseria Gonorrhoeae by PCR: NEGATIVE

## 2019-09-27 ENCOUNTER — Other Ambulatory Visit: Payer: Self-pay

## 2019-09-27 ENCOUNTER — Ambulatory Visit: Payer: Self-pay | Admitting: Gerontology

## 2019-09-27 ENCOUNTER — Encounter: Payer: Self-pay | Admitting: Gerontology

## 2019-09-27 VITALS — BP 129/83 | HR 66 | Ht 71.0 in | Wt 170.0 lb

## 2019-09-27 DIAGNOSIS — K0889 Other specified disorders of teeth and supporting structures: Secondary | ICD-10-CM | POA: Insufficient documentation

## 2019-09-27 DIAGNOSIS — Z7689 Persons encountering health services in other specified circumstances: Secondary | ICD-10-CM

## 2019-09-27 DIAGNOSIS — H538 Other visual disturbances: Secondary | ICD-10-CM | POA: Insufficient documentation

## 2019-09-27 DIAGNOSIS — F17200 Nicotine dependence, unspecified, uncomplicated: Secondary | ICD-10-CM | POA: Insufficient documentation
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
## 2019-09-27 NOTE — Progress Notes (Signed)
Patient ID: John Spence, male   DOB: 12-12-1990, 29 y.o.   MRN: [id number]  Chief Complaint  Patient presents with  . Establish Care    needs 2 wisdom teeth removed    HPI John Spence is a 29 y.o. male who presents to establish care and evaluation of his dental pain. He states that he has cavities to his bilateral lower mandibular wisdom teeth. He states that it hurts when he chews, he denies any swelling, jaw nor sinus pain. He was seen at the ED for abdominal pain and bilateral wisdom teeth pain on 07/17/2019. He also c/o experiencing intermittent blurry vision and have not had an eye exam. He states that he smokes 2 cigarettes daily and admits the desire to quit. Overall, he states that he's doing well and offers no further complaint.    Social History Social History   Tobacco Use  . Smoking status: Heavy Tobacco Smoker    Packs/day: 1.00    Types: Cigarettes  . Smokeless tobacco: Never Used  . Tobacco comment: down to 2 cigarettes  Substance Use Topics  . Alcohol use: Yes    Comment: occasional  . Drug use: Not Currently    Types: Marijuana    No Known Allergies  No current outpatient medications on file.   No current facility-administered medications for this visit.    Review of Systems Review of Systems  Constitutional: Negative.   HENT: Positive for dental problem.   Eyes: Positive for visual disturbance.  Respiratory: Negative.   Cardiovascular: Negative.   Gastrointestinal: Negative.   Endocrine: Negative.   Genitourinary: Negative.   Musculoskeletal: Negative.   Skin: Negative.   Allergic/Immunologic: Negative.   Neurological: Negative.   Hematological: Negative.   Psychiatric/Behavioral: Negative.     Blood pressure 129/83, pulse 66, height 5\' 11"  (1.803 m), weight 170 lb (77.1 kg), SpO2 97 %.  Physical Exam Physical Exam Constitutional:      Appearance: Normal appearance.  HENT:     Head: Normocephalic and atraumatic.     Nose: Nose normal.       Mouth/Throat:     Mouth: Mucous membranes are moist.   Eyes:     Extraocular Movements: Extraocular movements intact.     Pupils: Pupils are equal, round, and reactive to light.  Cardiovascular:     Rate and Rhythm: Normal rate and regular rhythm.     Pulses: Normal pulses.     Heart sounds: Normal heart sounds.  Pulmonary:     Effort: Pulmonary effort is normal.     Breath sounds: Normal breath sounds.  Abdominal:     General: Abdomen is flat. Bowel sounds are normal.     Palpations: Abdomen is soft.  Genitourinary:    Comments: Deferred per patient. Musculoskeletal:        General: Normal range of motion.     Cervical back: Normal range of motion.  Skin:    General: Skin is warm and dry.  Neurological:     General: No focal deficit present.     Mental Status: He is alert and oriented to person, place, and time. Mental status is at baseline.  Psychiatric:        Mood and Affect: Mood normal.        Behavior: Behavior normal.        Thought Content: Thought content normal.        Judgment: Judgment normal.     Data Reviewed Lab and past medical history was  reviewed.  Assessment and Plan  1. Encounter to establish care He was advised on smoking cessation and was provided with [place]uit line information.  2. Tooth pain  -  He was advised to complete [facility] charity care application for Ambulatory referral to Dentistry  3. Blurry vision, bilateral - He was advised to complete a charity care  Application for- Ambulatory referral to Ophthalmology  4. Smoking - Same as #1  Follow up: 03/27/2020 or if symptom fails to improve or worsens.  John Spence 09/27/2019, 9:46 AM

## 2019-11-10 ENCOUNTER — Emergency Department
Admission: EM | Admit: 2019-11-10 | Discharge: 2019-11-10 | Disposition: A | Discharge status: Home / Self Care | Payer: Self-pay | Attending: Emergency Medicine | Admitting: Emergency Medicine

## 2019-11-10 ENCOUNTER — Other Ambulatory Visit: Payer: Self-pay

## 2019-11-10 DIAGNOSIS — F17210 Nicotine dependence, cigarettes, uncomplicated: Secondary | ICD-10-CM | POA: Insufficient documentation

## 2019-11-10 DIAGNOSIS — K5900 Constipation, unspecified: Secondary | ICD-10-CM | POA: Insufficient documentation

## 2019-11-10 LAB — CBC
HCT: 43.6 % (ref 39.0–52.0)
Hemoglobin: 15.2 g/dL (ref 13.0–17.0)
MCH: 32.8 pg (ref 26.0–34.0)
MCHC: 34.9 g/dL (ref 30.0–36.0)
MCV: 94.2 fL (ref 80.0–100.0)
Platelets: 244 10*3/uL (ref 150–400)
RBC: 4.63 MIL/uL (ref 4.22–5.81)
RDW: 12.4 % (ref 11.5–15.5)
WBC: 5 10*3/uL (ref 4.0–10.5)
nRBC: 0 % (ref 0.0–0.2)

## 2019-11-10 LAB — COMPREHENSIVE METABOLIC PANEL
ALT: 26 U/L (ref 0–44)
AST: 18 U/L (ref 15–41)
Albumin: 4.9 g/dL (ref 3.5–5.0)
Alkaline Phosphatase: 76 U/L (ref 38–126)
Anion gap: 10 (ref 5–15)
BUN: 18 mg/dL (ref 6–20)
CO2: 24 mmol/L (ref 22–32)
Calcium: 9.5 mg/dL (ref 8.9–10.3)
Chloride: 107 mmol/L (ref 98–111)
Creatinine, Ser: 1.23 mg/dL (ref 0.61–1.24)
GFR calc Af Amer: >60 mL/min (ref >60)
GFR calc non Af Amer: >60 mL/min (ref >60)
Glucose, Bld: 90 mg/dL (ref 70–99)
Potassium: 3.6 mmol/L (ref 3.5–5.1)
Sodium: 141 mmol/L (ref 135–145)
Total Bilirubin: 1 mg/dL (ref 0.3–1.2)
Total Protein: 7.9 g/dL (ref 6.5–8.1)

## 2019-11-10 LAB — URINALYSIS, COMPLETE (UACMP) WITH MICROSCOPIC
Bacteria, UA: NONE SEEN
Bilirubin Urine: NEGATIVE
Glucose, UA: NEGATIVE mg/dL
Hgb urine dipstick: NEGATIVE
Ketones, ur: 20 mg/dL — AB
Leukocytes,Ua: NEGATIVE
Nitrite: NEGATIVE
Protein, ur: NEGATIVE mg/dL
Specific Gravity, Urine: 1.03 (ref 1.005–1.030)
Squamous Epithelial / HPF: NONE SEEN (ref 0–5)
pH: 5 (ref 5.0–8.0)

## 2019-11-10 LAB — CHLAMYDIA/NGC RT PCR (ARMC ONLY)
Chlamydia Tr: NOT DETECTED
N gonorrhoeae: NOT DETECTED

## 2019-11-10 LAB — LIPASE, BLOOD: Lipase: 25 U/L (ref 11–51)

## 2019-11-10 MED ORDER — AMOXICILLIN 875 MG PO TABS: 875.0000 mg | ORAL_TABLET | Freq: Two times a day (BID) | ORAL | 0 refills | Status: DC

## 2019-11-10 NOTE — ED Provider Notes (Signed)
[facility] Emergency Department Provider Note  ____________________________________________  Time seen: Approximately 2:05 PM  I have reviewed the triage vital signs and the nursing notes.   HISTORY  Chief Complaint Abdominal Pain and Other (std check)    HPI John Spence is a 29 y.o. male who presents the emergency department complaining of transient abdominal pain that has resolved.  Patient presented to the ED complaining of 5 days of vague abdominal pain.  Patient states that it has been moving throughout his abdomen.  It comes and goes.  Patient states that it is sometimes a cramping sensation, sometimes a full sensation.  Patient denies any nausea, vomiting, diarrhea.  Patient states that he has a history of chronic constipation however.  He is not taking any medications for his constipation.  Patient is concerned that he may have an STD as he has not sex directly prior to the onset of his abdominal pain.  He denies any known contact with STD.  He has had no dysuria, polyuria, hematuria.  No flank pain.  No genital lesions.  Patient does not take any medications for his complaint prior to arrival and is asymptomatic at this time.         No past medical history on file.  Patient Active Problem List   Diagnosis Date Noted  . Encounter to establish care 09/27/2019  . Tooth pain 09/27/2019  . Blurry vision, bilateral 09/27/2019  . Smoking 09/27/2019    No past surgical history on file.  Prior to Admission medications   Medication Sig Start Date End Date Taking? Authorizing Provider  amoxicillin (AMOXIL) 875 MG tablet Take 1 tablet (875 mg total) by mouth 2 (two) times daily. 11/10/19   Shantell Belongia, Delorise Royals, PA-C    Allergies Patient has no known allergies.  No family history on file.  Social History Social History   Tobacco Use  . Smoking status: Heavy Tobacco Smoker    Packs/day: 1.00    Types: Cigarettes  . Smokeless tobacco: Never  Used  . Tobacco comment: down to 2 cigarettes  Substance Use Topics  . Alcohol use: Yes    Comment: occasional  . Drug use: Not Currently    Types: Marijuana     Review of Systems  Constitutional: No fever/chills Eyes: No visual changes. No discharge ENT: No upper respiratory complaints. Cardiovascular: no chest pain. Respiratory: no cough. No SOB. Gastrointestinal: No abdominal pain.  No nausea, no vomiting.  No diarrhea.  No constipation. Genitourinary: Negative for dysuria. No hematuria Musculoskeletal: Negative for musculoskeletal pain. Skin: Negative for rash, abrasions, lacerations, ecchymosis. Neurological: Negative for headaches, focal weakness or numbness. 10-point ROS otherwise negative.  ____________________________________________   PHYSICAL EXAM:  VITAL SIGNS: ED Triage Vitals [11/10/19 1134]  Enc Vitals Group     BP (!) 158/110     Pulse Rate 88     Resp 18     Temp 98.6 F (37 C)     Temp Source Oral     SpO2 100 %     Weight 175 lb (79.4 kg)     Height 5\' 11"  (1.803 m)     Head Circumference      Peak Flow      Pain Score 8     Pain Loc      Pain Edu?      Excl. in GC?      Constitutional: Alert and oriented. Well appearing and in no acute distress. Eyes: Conjunctivae are normal. PERRL.  EOMI. Head: Atraumatic. ENT:      Ears:       Nose: No congestion/rhinnorhea.      Mouth/Throat: Mucous membranes are moist.  Neck: No stridor.    Cardiovascular: Normal rate, regular rhythm. Normal S1 and S2.  Good peripheral circulation. Respiratory: Normal respiratory effort without tachypnea or retractions. Lungs CTAB. Good air entry to the bases with no decreased or absent breath sounds. Gastrointestinal: Bowel sounds 4 quadrants. Soft and nontender to palpation. No guarding or rigidity. No palpable masses. No distention. No CVA tenderness. Musculoskeletal: Full range of motion to all extremities. No gross deformities appreciated. Neurologic:  Normal  speech and language. No gross focal neurologic deficits are appreciated.  Skin:  Skin is warm, dry and intact. No rash noted. Psychiatric: Mood and affect are normal. Speech and behavior are normal. Patient exhibits appropriate insight and judgement.   ____________________________________________   LABS (all labs ordered are listed, but only abnormal results are displayed)  Labs Reviewed  URINALYSIS, COMPLETE (UACMP) WITH MICROSCOPIC - Abnormal; Notable for the following components:      Result Value   Color, Urine YELLOW (*)    APPearance CLEAR (*)    Ketones, ur 20 (*)    All other components within normal limits  CHLAMYDIA/NGC RT PCR ([facility] ONLY)  LIPASE, BLOOD  COMPREHENSIVE METABOLIC PANEL  CBC   ____________________________________________  EKG   ____________________________________________  RADIOLOGY   No results found.  ____________________________________________    PROCEDURES  Procedure(s) performed:    Procedures    Medications - No data to display   ____________________________________________   INITIAL IMPRESSION / ASSESSMENT AND PLAN / ED COURSE  Pertinent labs & imaging results that were available during my care of the patient were reviewed by me and considered in my medical decision making (see chart for details).  Review of the [place] CSRS was performed in accordance of the [facility] prior to dispensing any controlled drugs.           Patient's diagnosis is consistent with constipation.  Patient presented to emergency department complaining of abdominal pain x5 days.  This has been intermittent in nature, pain sensation is in different regions of his abdomen at different times.  Currently asymptomatic.  Patient was concerned that this may be an STD as pain started after he had had sex.  Patient had no urogenital complaints of dysuria, polyuria, hematuria.  No genital lesions or sores.  Exam was benign at this time.  Labs are reassuring.   I feel that given patient's symptoms, his history of chronic constipation, that his symptoms are more likely secondary to constipation.  Differential included STD, urinary tract infection, nephrolithiasis, appendicitis, cholelithiasis, irritable bowel syndrome, constipation, gastroenteritis.  No indication for imaging at this time.  Patient is given a prescription for antibiotics for his wisdom teeth as he states that his oral surgeon told him that he needed to start antibiotics prior to his dental extraction.  No signs of infection on exam or through his history.  Patient will use over-the-counter MiraLAX for his constipation.  Patient does not want any prescriptions for his constipation at this time.  Follow-up primary care as needed..Patient is given ED precautions to return to the ED for any worsening or new symptoms.     ____________________________________________  FINAL CLINICAL IMPRESSION(S) / ED DIAGNOSES  Final diagnoses:  Constipation, unspecified constipation type      NEW MEDICATIONS STARTED DURING THIS VISIT:  ED Discharge Orders  Ordered    amoxicillin (AMOXIL) 875 MG tablet  2 times daily     11/10/19 1447              This chart was dictated using voice recognition software/Dragon. Despite best efforts to proofread, errors can occur which can change the meaning. Any change was purely unintentional.    Darletta Moll, PA-C 11/10/19 1543    Lavonia Drafts, MD 11/11/19 [phone]

## 2019-11-10 NOTE — ED Triage Notes (Signed)
Pt comes POV after having sex 4 days ago and having abdominal discomfort since then. Denies n/v/d. Denies discharge. Wants to be checked for STDs.

## 2019-11-10 NOTE — ED Notes (Signed)
Patient states his dentist suggested that he come here for antibiotic prior to seeing him about the wisdom teeth. Patient states he had unprotected sex approximately 5 days ago and had abdominal pain the day after.

## 2019-11-10 NOTE — Discharge Instructions (Signed)
Pick up some MiraLAX, add it to juice or Gatorade.  Drink plenty of fluids and eat dietary fiber.

## 2019-11-25 ENCOUNTER — Other Ambulatory Visit: Payer: Self-pay

## 2019-11-25 ENCOUNTER — Encounter: Payer: Self-pay | Admitting: Emergency Medicine

## 2019-11-25 ENCOUNTER — Emergency Department
Admission: EM | Admit: 2019-11-25 | Discharge: 2019-11-25 | Disposition: A | Discharge status: Home / Self Care | Payer: Self-pay | Attending: Emergency Medicine | Admitting: Emergency Medicine

## 2019-11-25 DIAGNOSIS — R590 Localized enlarged lymph nodes: Secondary | ICD-10-CM | POA: Insufficient documentation

## 2019-11-25 DIAGNOSIS — K029 Dental caries, unspecified: Secondary | ICD-10-CM | POA: Insufficient documentation

## 2019-11-25 DIAGNOSIS — F17210 Nicotine dependence, cigarettes, uncomplicated: Secondary | ICD-10-CM | POA: Insufficient documentation

## 2019-11-25 DIAGNOSIS — R591 Generalized enlarged lymph nodes: Secondary | ICD-10-CM

## 2019-11-25 DIAGNOSIS — K0262 Dental caries on smooth surface penetrating into dentin: Secondary | ICD-10-CM

## 2019-11-25 LAB — CBC WITH DIFFERENTIAL/PLATELET
Abs Immature Granulocytes: 0.02 10*3/uL (ref 0.00–0.07)
Basophils Absolute: 0.1 10*3/uL (ref 0.0–0.1)
Basophils Relative: 1 %
Eosinophils Absolute: 0 10*3/uL (ref 0.0–0.5)
Eosinophils Relative: 0 %
HCT: 42.6 % (ref 39.0–52.0)
Hemoglobin: 14.4 g/dL (ref 13.0–17.0)
Immature Granulocytes: 0 %
Lymphocytes Relative: 14 %
Lymphs Abs: 1.5 10*3/uL (ref 0.7–4.0)
MCH: 32.1 pg (ref 26.0–34.0)
MCHC: 33.8 g/dL (ref 30.0–36.0)
MCV: 94.9 fL (ref 80.0–100.0)
Monocytes Absolute: 0.6 10*3/uL (ref 0.1–1.0)
Monocytes Relative: 6 %
Neutro Abs: 8.4 10*3/uL — ABNORMAL HIGH (ref 1.7–7.7)
Neutrophils Relative %: 79 %
Platelets: 273 10*3/uL (ref 150–400)
RBC: 4.49 MIL/uL (ref 4.22–5.81)
RDW: 12.9 % (ref 11.5–15.5)
WBC: 10.6 10*3/uL — ABNORMAL HIGH (ref 4.0–10.5)
nRBC: 0 % (ref 0.0–0.2)

## 2019-11-25 LAB — BASIC METABOLIC PANEL
Anion gap: 7 (ref 5–15)
BUN: 13 mg/dL (ref 6–20)
CO2: 27 mmol/L (ref 22–32)
Calcium: 9.5 mg/dL (ref 8.9–10.3)
Chloride: 107 mmol/L (ref 98–111)
Creatinine, Ser: 0.99 mg/dL (ref 0.61–1.24)
GFR calc Af Amer: >60 mL/min (ref >60)
GFR calc non Af Amer: >60 mL/min (ref >60)
Glucose, Bld: 101 mg/dL — ABNORMAL HIGH (ref 70–99)
Potassium: 4.1 mmol/L (ref 3.5–5.1)
Sodium: 141 mmol/L (ref 135–145)

## 2019-11-25 MED ORDER — CLINDAMYCIN HCL 300 MG PO CAPS: 300.0000 mg | ORAL_CAPSULE | Freq: Three times a day (TID) | ORAL | 0 refills | Status: AC

## 2019-11-25 MED ORDER — CLINDAMYCIN HCL 150 MG PO CAPS
300.0000 mg | ORAL_CAPSULE | Freq: Once | ORAL | Status: AC
  Administered 2019-11-25: 300 mg via ORAL
  Filled 2019-11-25: qty 2

## 2019-11-25 NOTE — ED Triage Notes (Signed)
Pt to ER with c/o swelling to bilateral neck below ears.  Noted swelling to lymph nodes. Pt states pain with touch, otherwise no pain.  Pt denies cough, congestion, etc.  States ear pain related to wisdom teeth that need to be removed.

## 2019-11-25 NOTE — ED Notes (Signed)
See triage not- pt here c/o neck swelling below ears bilaterally. Denies pain. No difficulty breathing. Reports this started this morning.

## 2019-11-25 NOTE — Discharge Instructions (Signed)
You are being treated with an antibiotic for infected or inflamed lymph nodes. Take the meds as directed, until all pills are gone. Follow-up with a dental provider listed below, for dental extractions. Return to the ED as needed.   OPTIONS FOR DENTAL FOLLOW UP CARE  [place] Department of Health and Human Services - Local Safety Net Dental Clinics [email or web address]   [facility] [phone])  [facility] [phone])  [place] [phone] ext [phone])  [facility] [phone])  [facility] [phone]) This clinic caters to the indigent population and is on a lottery system. Location: [employer] [facility], [employer], [address] [place] Clinic Hours: Wednesdays from 6pm - 9pm, patients seen by a lottery system. For dates, call or go to [email or web address] Services: Cleanings, fillings and simple extractions. Payment Options: DENTAL WORK IS FREE OF CHARGE. Bring proof of income or support. Best way to get seen: Arrive at 5:15 pm - this is a lottery, NOT first come/first serve, so arriving earlier will not increase your chances of being seen.     [facility] [phone] Select option 1 for emergencies   Location: [facility], [place], [address] [place] Clinic Hours: No walk-ins accepted - call the day before to schedule an appointment. Check in times are 9:30 am and 1:30 pm. Services: Simple extractions, temporary fillings, pulpectomy/pulp debridement, uncomplicated abscess drainage. Payment Options: PAYMENT IS DUE AT THE TIME OF SERVICE.  Fee is usually $100-200, additional surgical procedures (e.g. abscess drainage) may be extra. Cash, checks, Visa/MasterCard accepted.  Can file Medicaid if patient is covered for dental - patient should call case worker to check. No  discount for [facility] patients. Best way to get seen: MUST call the day before and get onto the schedule. Can usually be seen the next 1-2 days. No walk-ins accepted.     [facility] [phone]   Location: [facility], [address] [place] Clinic Hours: M, W, Th, F 8am or 1:30pm, Tues 9a or 1:30 - first come/first served. Services: Simple extractions, temporary fillings, uncomplicated abscess drainage.  You do not need to be an [facility] resident. Payment Options: PAYMENT IS DUE AT THE TIME OF SERVICE. Dental insurance, otherwise sliding scale - bring proof of income or support. Depending on income and treatment needed, cost is usually $50-200. Best way to get seen: Arrive early as it is first come/first served.     [facility] [phone]   Location: [address] Clinic Hours: Mon-Thu 8a-5p Services: Most basic dental services including extractions and fillings. Payment Options: PAYMENT IS DUE AT THE TIME OF SERVICE. Sliding scale, up to 50% off - bring proof if income or support. Medicaid with dental option accepted. Best way to get seen: Call to schedule an appointment, can usually be seen within 2 weeks OR they will try to see walk-ins - show up at 8a or 2p (you may have to wait).     [facility] [phone] [place] RESIDENTS ONLY   Location: [facility], [address] [place], [place] [address] Clinic Hours: By appointment only. Monday - Thursday 8am-5pm, Friday 8am-12pm Services: Cleanings, fillings, extractions. Payment Options: PAYMENT IS DUE AT THE TIME OF SERVICE. Cash, Visa or MasterCard. Sliding scale - $30 minimum per service. Best way to get seen: Come in to office, complete packet and make an appointment -  need proof of income or support monies for each household member and proof of [place]  residence. Usually takes about a month to get in.     [facility] [phone]   Location: [address] [place] Clinic Hours: Walk-in Urgent Care Dental Services are offered Monday-Friday mornings only. The numbers of emergencies accepted daily is limited to the number of providers available. Maximum 15 - Mondays, Wednesdays & Thursdays Maximum 10 - Tuesdays & Fridays Services: You do not need to be a [place] resident to be seen for a dental emergency. Emergencies are defined as pain, swelling, abnormal bleeding, or dental trauma. Walkins will receive x-rays if needed. NOTE: Dental cleaning is not an emergency. Payment Options: PAYMENT IS DUE AT THE TIME OF SERVICE. Minimum co-pay is $40.00 for uninsured patients. Minimum co-pay is $3.00 for Medicaid with dental coverage. Dental Insurance is accepted and must be presented at time of visit. Medicare does not cover dental. Forms of payment: Cash, credit card, checks. Best way to get seen: If not previously registered with the clinic, walk-in dental registration begins at 7:15 am and is on a first come/first serve basis. If previously registered with the clinic, call to make an appointment.     [facility] [phone] [place] RESIDENTS ONLY   Location: [address], [place], [place] Clinic Hours: Mon-Thu 10a-2p Services: Extractions only! Payment Options: FREE (donations accepted) - bring proof of income or support Best way to get seen: Call and schedule an appointment OR come at 8am on the 1st Monday of every month (except for holidays) when it is first come/first served.     [facility] [phone]   Location: [address], [place] Clinic Hours: Friday mornings Services, Payment Options, Best way to get seen: Call for info  

## 2019-11-25 NOTE — ED Provider Notes (Signed)
[facility] Emergency Department Provider Note ____________________________________________  Time seen: 1510  I have reviewed the triage vital signs and the nursing notes.  HISTORY  Chief Complaint  Adenopathy  HPI John Spence is a 29 y.o. male presents himself to the ED for concern over swelling to the bilateral sides of the neck.  Patient describes tender palpable nodules to the neck, below the ears.  He says it slightly tender to touch otherwise he denies any neck pain, ear pain, sore throat pain, fever, chills, or sweats.  He also denies any recent weight loss, cough, congestion, chest pain, shortness of breath.  Patient does have some pain to the ears that he is relates is due to wisdom teeth that need to be removed.  Patient was evaluated 2 weeks prior and started on a course of Augmentin, which he did not complete. Patient medical history significant for being an everyday smoker, otherwise he denies any significant medical history and takes no daily medications.  History reviewed. No pertinent past medical history.  Patient Active Problem List   Diagnosis Date Noted  . Encounter to establish care 09/27/2019  . Tooth pain 09/27/2019  . Blurry vision, bilateral 09/27/2019  . Smoking 09/27/2019    History reviewed. No pertinent surgical history.  Prior to Admission medications   Medication Sig Start Date End Date Taking? Authorizing Provider  amoxicillin (AMOXIL) 875 MG tablet Take 1 tablet (875 mg total) by mouth 2 (two) times daily. 11/10/19   Cuthriell, Delorise Royals, PA-C  clindamycin (CLEOCIN) 300 MG capsule Take 1 capsule (300 mg total) by mouth 3 (three) times daily for 10 days. 11/25/19 12/05/19  Renee Beale, Charlesetta Ivory, PA-C    Allergies Patient has no known allergies.  History reviewed. No pertinent family history.  Social History Social History   Tobacco Use  . Smoking status: Heavy Tobacco Smoker    Packs/day: 1.00    Types: Cigarettes   . Smokeless tobacco: Never Used  . Tobacco comment: down to 2 cigarettes  Vaping Use  . Vaping Use: Never used  Substance Use Topics  . Alcohol use: Yes    Comment: occasional  . Drug use: Not Currently    Types: Marijuana    Review of Systems  Constitutional: Negative for fever. Eyes: Negative for visual changes. ENT: Negative for sore throat. Reports tender (lymph) nodes in neck Cardiovascular: Negative for chest pain. Respiratory: Negative for shortness of breath. Gastrointestinal: Negative for abdominal pain, vomiting and diarrhea. Genitourinary: Negative for dysuria. Musculoskeletal: Negative for back pain. Skin: Negative for rash. Neurological: Negative for headaches, focal weakness or numbness. ____________________________________________  PHYSICAL EXAM:  VITAL SIGNS: ED Triage Vitals [11/25/19 1443]  Enc Vitals Group     BP (!) 132/92     Pulse Rate 97     Resp 18     Temp 98.7 F (37.1 C)     Temp Source Oral     SpO2      Weight 170 lb (77.1 kg)     Height 5\' 11"  (1.803 m)     Head Circumference      Peak Flow      Pain Score 0     Pain Loc      Pain Edu?      Excl. in GC?     Constitutional: Alert and oriented. Well appearing and in no distress. Head: Normocephalic and atraumatic. Eyes: Conjunctivae are normal. PERRL. Normal extraocular movements Ears: Canals clear. TMs intact bilaterally. Nose: No  congestion/rhinorrhea/epistaxis. Mouth/Throat: Mucous membranes are moist. Uvula is midline and tonsils are flat. No oropharyngeal lesions noted. No parotid gland fullness noted. Bilateral mandibular 3rd molars with occlusal surface enamel absent, revealing underlying dentin. No focal gum swelling or edema appreciated. No brawny sublingual edema appreciated.  Neck: Supple. No thyromegaly. Hematological/Lymphatic/Immunological: No palpable cervical lymphadenopathy. The skin over the tonsillar lymph nodes bilaterally, is erythematous and appears raised. No  other palpable cervical, mandibular, sublingual, preauricular, occipital, or supraclavicular lymph noted appreciated. Cardiovascular: Normal rate, regular rhythm. Normal distal pulses. Respiratory: Normal respiratory effort. No wheezes/rales/rhonchi. Gastrointestinal: Soft and nontender. No distention. Musculoskeletal: Nontender with normal range of motion in all extremities.  Neurologic:  Normal gait without ataxia. Normal speech and language. No gross focal neurologic deficits are appreciated. Skin:  Skin is warm, dry and intact. No rash noted. Psychiatric: Mood and affect are normal. Patient exhibits appropriate insight and judgment. ____________________________________________   LABS (pertinent positives/negatives) Labs Reviewed  BASIC METABOLIC PANEL - Abnormal; Notable for the following components:      Result Value   Glucose, Bld 101 (*)    All other components within normal limits  CBC WITH DIFFERENTIAL/PLATELET - Abnormal; Notable for the following components:   WBC 10.6 (*)    Neutro Abs 8.4 (*)    All other components within normal limits   ____________________________________________  PROCEDURES  Clindamycin 300 mg PO  Procedures ____________________________________________  INITIAL IMPRESSION / ASSESSMENT AND PLAN / ED COURSE  DDX: parotiditis, dental pain due to caries, dental abscess, impacted wisdom teeth, lymphadenopathy  Patient with ED evaluation of concern over swelling and bilateral redness to the angle of the jaw. He was evaluated for infection and found to have essentially normal labs. He will be treated empirically for a dental infection as the cause of his bilateral jaw swelling. He is referred to a dental provider for definitive management. A prescription for clindamycin is provided. He will return as discussed, for worsening symptoms.  John Spence was evaluated in Emergency Department on 11/25/2019 for the symptoms described in the history of present  illness. He was evaluated in the context of the global COVID-19 pandemic, which necessitated consideration that the patient might be at risk for infection with the SARS-CoV-2 virus that causes COVID-19. Institutional protocols and algorithms that pertain to the evaluation of patients at risk for COVID-19 are in a state of rapid change based on information released by regulatory bodies including the CDC and federal and state organizations. These policies and algorithms were followed during the patient's care in the ED. ____________________________________________  FINAL CLINICAL IMPRESSION(S) / ED DIAGNOSES  Final diagnoses:  Lymphadenopathy  Dental caries extending into dentin      My Rinke, Dannielle Karvonen, PA-C 11/25/19 2333    Vanessa Megargel, MD 11/27/19 [phone]

## 2019-12-06 ENCOUNTER — Ambulatory Visit: Payer: Self-pay | Admitting: Physician Assistant

## 2019-12-06 ENCOUNTER — Encounter: Payer: Self-pay | Admitting: Physician Assistant

## 2019-12-06 ENCOUNTER — Other Ambulatory Visit: Payer: Self-pay

## 2019-12-06 DIAGNOSIS — Z113 Encounter for screening for infections with a predominantly sexual mode of transmission: Secondary | ICD-10-CM

## 2019-12-06 DIAGNOSIS — F419 Anxiety disorder, unspecified: Secondary | ICD-10-CM

## 2019-12-06 NOTE — Progress Notes (Signed)
Pt here for screening today and reports he only desires screening for HIV.

## 2019-12-07 ENCOUNTER — Encounter: Payer: Self-pay | Admitting: Physician Assistant

## 2019-12-07 ENCOUNTER — Telehealth: Payer: Self-pay | Admitting: Family Medicine

## 2019-12-07 DIAGNOSIS — F419 Anxiety disorder, unspecified: Secondary | ICD-10-CM | POA: Insufficient documentation

## 2019-12-07 NOTE — Progress Notes (Signed)
   [facility] Department STI clinic/screening visit  Subjective:  John Spence is a 29 y.o. male being seen today for an STI screening visit. The patient reports they do not have symptoms.    Patient has the following medical conditions:   Patient Active Problem List   Diagnosis Date Noted  . Encounter to establish care 09/27/2019  . Tooth pain 09/27/2019  . Blurry vision, bilateral 09/27/2019  . Smoking 09/27/2019     Chief Complaint  Patient presents with  . SEXUALLY TRANSMITTED DISEASE    screening    HPI  Patient reports that he is not having any symptoms but would like a screening for HIV today.  Denies chronic conditions and regular medicines.  Declines other screening today since was seen at the  ER a few days ago and tested for infections.  Reports that he has anxiety, but at the moment is not in counseling.  States that he has not had a HIV test in the past.   See flowsheet for further details and programmatic requirements.    The following portions of the patient's history were reviewed and updated as appropriate: allergies, current medications, past medical history, past social history, past surgical history and problem list.  Objective:  There were no vitals filed for this visit.  Physical Exam Constitutional:      General: He is not in acute distress.    Appearance: Normal appearance.  HENT:     Head: Normocephalic and atraumatic.  Eyes:     Conjunctiva/sclera: Conjunctivae normal.  Pulmonary:     Effort: Pulmonary effort is normal.  Neurological:     Mental Status: He is alert and oriented to person, place, and time.  Psychiatric:        Mood and Affect: Mood normal.        Behavior: Behavior normal.        Thought Content: Thought content normal.        Judgment: Judgment normal.       Assessment and Plan:  John Spence is a 29 y.o. male presenting to the [facility] Department for STI screening  1. Screening for  STD (sexually transmitted disease) Patient into clinic without symptoms. Patient declines referral to LCSW, but does accept LCSW card to call for an appointment if he changes his mind.  Rec condoms with all sex. Await test results.  Counseled that RN will call if needs to RTC for treatment once results are back. - HIV  LAB     No follow-ups on file.  Future Appointments  Date Time Provider Department Center  03/27/2020  9:00 AM [place], Francoise Ceo, NP [facility] None    Matt Holmes, [place]

## 2019-12-07 NOTE — Telephone Encounter (Signed)
Returned patient phone call to provided number. No answer, LMTC. Zeki Bedrosian, RN  

## 2019-12-10 NOTE — Telephone Encounter (Signed)
Phone call to pt. Left message that RN with [facility] is returning phone call, if still need assistance, give [place] a call back at  [phone].

## 2019-12-10 NOTE — Telephone Encounter (Signed)
Returned call to pt at phone # provided. Call went to voicemail and message left to return call to [facility] and call back phone # provided.

## 2019-12-18 ENCOUNTER — Telehealth: Payer: Self-pay | Admitting: Gerontology

## 2019-12-18 NOTE — Telephone Encounter (Signed)
Individual has been contacted regarding ED referral. Cancelled f/u appt as they do not have proper documents on file. Will r/s once documents are brought in. Pt is aware of the documents needed.

## 2020-01-14 ENCOUNTER — Encounter: Payer: Self-pay | Admitting: Emergency Medicine

## 2020-01-14 ENCOUNTER — Other Ambulatory Visit: Payer: Self-pay

## 2020-01-14 ENCOUNTER — Emergency Department
Admission: EM | Admit: 2020-01-14 | Discharge: 2020-01-14 | Disposition: A | Discharge status: Home / Self Care | Payer: Self-pay | Attending: Emergency Medicine | Admitting: Emergency Medicine

## 2020-01-14 DIAGNOSIS — F17210 Nicotine dependence, cigarettes, uncomplicated: Secondary | ICD-10-CM | POA: Insufficient documentation

## 2020-01-14 DIAGNOSIS — K047 Periapical abscess without sinus: Secondary | ICD-10-CM

## 2020-01-14 DIAGNOSIS — K029 Dental caries, unspecified: Secondary | ICD-10-CM | POA: Insufficient documentation

## 2020-01-14 MED ORDER — NAPROXEN 500 MG PO TABS
500.0000 mg | ORAL_TABLET | Freq: Once | ORAL | Status: AC
  Administered 2020-01-14: 500 mg via ORAL
  Filled 2020-01-14: qty 1

## 2020-01-14 MED ORDER — LIDOCAINE VISCOUS HCL 2 % MT SOLN
15.0000 mL | Freq: Once | OROMUCOSAL | Status: AC
  Administered 2020-01-14: 15 mL via OROMUCOSAL
  Filled 2020-01-14: qty 15

## 2020-01-14 MED ORDER — TRAMADOL HCL 50 MG PO TABS: 50.0000 mg | ORAL_TABLET | Freq: Four times a day (QID) | ORAL | 0 refills | Status: AC | PRN

## 2020-01-14 MED ORDER — LIDOCAINE VISCOUS HCL 2 % MT SOLN: 5.0000 mL | Freq: Four times a day (QID) | OROMUCOSAL | 0 refills | Status: DC | PRN

## 2020-01-14 MED ORDER — NAPROXEN 500 MG PO TABS: 500.0000 mg | ORAL_TABLET | Freq: Two times a day (BID) | ORAL | 0 refills | Status: DC

## 2020-01-14 MED ORDER — AMOXICILLIN 500 MG PO CAPS: 500.0000 mg | ORAL_CAPSULE | Freq: Three times a day (TID) | ORAL | 0 refills | Status: DC

## 2020-01-14 NOTE — ED Provider Notes (Signed)
[facility] Emergency Department Provider Note   ____________________________________________   First MD Initiated Contact with Patient 01/14/20 1342     (approximate)  I have reviewed the triage vital signs and the nursing notes.   HISTORY  Chief Complaint Dental Pain    HPI John Spence is a 29 y.o. male patient presents with dental pain for 2 days.  Patient stated some mild swelling to the left jaw.  Patient denies fever associated with complaint.  Patient stated he is waiting for his employer to take effect before seen a dentist.  Patient rates pain as a 10/10.  Patient scribed pain is "achy".  No palliative measure for complaint.     History reviewed. No pertinent past medical history.  Patient Active Problem List   Diagnosis Date Noted  . Anxiety 12/07/2019  . Encounter to establish care 09/27/2019  . Tooth pain 09/27/2019  . Blurry vision, bilateral 09/27/2019  . Smoking 09/27/2019    History reviewed. No pertinent surgical history.  Prior to Admission medications   Medication Sig Start Date End Date Taking? Authorizing Provider  amoxicillin (AMOXIL) 500 MG capsule Take 1 capsule (500 mg total) by mouth 3 (three) times daily. 01/14/20   Joni Reining, PA-C  lidocaine (XYLOCAINE) 2 % solution Use as directed 5 mLs in the mouth or throat every 6 (six) hours as needed for mouth pain. For oral swish 01/14/20   Joni Reining, PA-C  naproxen (NAPROSYN) 500 MG tablet Take 1 tablet (500 mg total) by mouth 2 (two) times daily with a meal. 01/14/20   Joni Reining, PA-C  traMADol (ULTRAM) 50 MG tablet Take 1 tablet (50 mg total) by mouth every 6 (six) hours as needed. 01/14/20 01/13/21  Joni Reining, PA-C    Allergies Patient has no known allergies.  No family history on file.  Social History Social History   Tobacco Use  . Smoking status: Current Every Day Smoker    Packs/day: 1.00    Types: Cigarettes  . Smokeless tobacco: Never Used    . Tobacco comment: down to 2 cigarettes  Vaping Use  . Vaping Use: Never used  Substance Use Topics  . Alcohol use: Yes    Comment: Occasional  . Drug use: Yes    Types: Marijuana    Review of Systems Constitutional: No fever/chills Eyes: No visual changes. ENT: No sore throat.  Dental pain. Cardiovascular: Denies chest pain. Respiratory: Denies shortness of breath. Gastrointestinal: No abdominal pain.  No nausea, no vomiting.  No diarrhea.  No constipation. Genitourinary: Negative for dysuria. Musculoskeletal: Negative for back pain. Skin: Negative for rash. Neurological: Negative for headaches, focal weakness or numbness.   ____________________________________________   PHYSICAL EXAM:  VITAL SIGNS: ED Triage Vitals [01/14/20 1242]  Enc Vitals Group     BP 134/85     Pulse Rate 61     Resp 15     Temp 98.6 F (37 C)     Temp Source Oral     SpO2 100 %     Weight 170 lb (77.1 kg)     Height 5\' 11"  (1.803 m)     Head Circumference      Peak Flow      Pain Score 10     Pain Loc      Pain Edu?      Excl. in GC?    Constitutional: Alert and oriented. Well appearing and in no acute distress. Mouth/Throat: Mucous membranes  are moist.  Oropharynx non-erythematous.  Gingiva edema divided tooth #3. Neck: No stridor. Eyes Hematological/Lymphatic/Immunilogical: No cervical lymphadenopathy. Cardiovascular: Normal rate, regular rhythm. Grossly normal heart sounds.  Good peripheral circulation. Respiratory: Normal respiratory effort.  No retractions. Lungs CTAB. Gastrointestinal: Soft and nontender. No distention. No abdominal bruits. No CVA tenderness. Musculoskeletal: No lower extremity tenderness nor edema.  No joint effusions. Neurologic:  Normal speech and language. No gross focal neurologic deficits are appreciated. No gait instability. Skin:  Skin is warm, dry and intact. No rash noted. Psychiatric: Mood and affect are normal. Speech and behavior are  normal.  ____________________________________________   LABS (all labs ordered are listed, but only abnormal results are displayed)  Labs Reviewed - No data to display ____________________________________________  EKG   ____________________________________________  RADIOLOGY  ED MD interpretation:    Official radiology report(s): No results found.  ____________________________________________   PROCEDURES  Procedure(s) performed (including Critical Care):  Procedures   ____________________________________________   INITIAL IMPRESSION / ASSESSMENT AND PLAN / ED COURSE  As part of my medical decision making, I reviewed the following data within the electronic MEDICAL RECORD NUMBER      Patient presents with dental pain secondary to gingivitis and devitalized tooth #3.  No true abscess visible.  Patient given discharge care instructions and advised to follow-up from list of dental clinics provided in discharge care instructions.   John Spence was evaluated in Emergency Department on 01/14/2020 for the symptoms described in the history of present illness. He was evaluated in the context of the global COVID-19 pandemic, which necessitated consideration that the patient might be at risk for infection with the SARS-CoV-2 virus that causes COVID-19. Institutional protocols and algorithms that pertain to the evaluation of patients at risk for COVID-19 are in a state of rapid change based on information released by regulatory bodies including the CDC and federal and state organizations. These policies and algorithms were followed during the patient's care in the ED.       ____________________________________________   FINAL CLINICAL IMPRESSION(S) / ED DIAGNOSES  Final diagnoses:  Infected dental caries     ED Discharge Orders         Ordered    amoxicillin (AMOXIL) 500 MG capsule  3 times daily     Discontinue  Reprint     01/14/20 1356    naproxen (NAPROSYN) 500 MG  tablet  2 times daily with meals     Discontinue  Reprint     01/14/20 1356    traMADol (ULTRAM) 50 MG tablet  Every 6 hours PRN     Discontinue  Reprint     01/14/20 1356    lidocaine (XYLOCAINE) 2 % solution  Every 6 hours PRN     Discontinue  Reprint     01/14/20 1356           Note:  This document was prepared using Dragon voice recognition software and may include unintentional dictation errors.    Joni Reining, PA-C 01/14/20 1405    Gilles Chiquito, MD 01/14/20 1640

## 2020-01-14 NOTE — ED Notes (Signed)
See triage note  Presents with dental pain  States pain started couple of days ago  Pain is mainly to left lower

## 2020-01-14 NOTE — ED Triage Notes (Signed)
Pt in via POV, reports left lower dental pain x 2 days, swelling noted to left jaw.  Hx of same.  Ambulatory to triage, NAD noted at this time.

## 2020-01-14 NOTE — Discharge Instructions (Addendum)
Advised to follow-up from list of dental clinics listed in your discharge care instruction.

## 2020-01-16 ENCOUNTER — Telehealth: Payer: Self-pay | Admitting: Gerontology

## 2020-01-16 NOTE — Telephone Encounter (Signed)
-----   Message from Rolm Gala, NP sent at 01/15/2020  9:01 AM EDT ----- Pls follow up with Mr. Grandt with regards to his documentation so an appointment will be scheduled for him as a new patient. Thank you

## 2020-03-01 ENCOUNTER — Emergency Department
Admission: EM | Admit: 2020-03-01 | Discharge: 2020-03-01 | Disposition: A | Discharge status: Home / Self Care | Payer: HRSA Program | Attending: Emergency Medicine | Admitting: Emergency Medicine

## 2020-03-01 ENCOUNTER — Other Ambulatory Visit: Payer: Self-pay

## 2020-03-01 DIAGNOSIS — U071 COVID-19: Secondary | ICD-10-CM | POA: Diagnosis not present

## 2020-03-01 DIAGNOSIS — Z5321 Procedure and treatment not carried out due to patient leaving prior to being seen by health care provider: Secondary | ICD-10-CM | POA: Diagnosis not present

## 2020-03-01 DIAGNOSIS — R439 Unspecified disturbances of smell and taste: Secondary | ICD-10-CM | POA: Diagnosis present

## 2020-03-01 LAB — SARS CORONAVIRUS 2 BY RT PCR (HOSPITAL ORDER, PERFORMED IN ~~LOC~~ HOSPITAL LAB): SARS Coronavirus 2: POSITIVE — AB

## 2020-03-01 NOTE — ED Notes (Signed)
Pt not in room at this time. EDT Caitlyn unable to locate him.

## 2020-03-01 NOTE — ED Triage Notes (Signed)
Pt comes in with not being able to taste or smell in 24 hours. Denies contacts. Denies other symptoms.

## 2020-03-02 ENCOUNTER — Telehealth: Payer: Self-pay | Admitting: Nurse Practitioner

## 2020-03-02 NOTE — Telephone Encounter (Signed)
Called patient to discuss Covid symptoms and the use of casirivimab/imdevimab, a monoclonal antibody infusion for those with mild to moderate Covid symptoms and at a high risk of hospitalization.  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  He is currently in his room on the other side of the house and she is trying to stay away from him b/c she worries about getting COVID.  No one in the house is vaccinated.  She will give him my message and hotline phone number, so that he may call back if he is interested in hearing more about mAb, he can call.  She said that at this time, he is not complaining of any symptoms.  I also offered to put her in touch with our subcutaneous mAb clinic, however at this time, she is not interested.  I did encourage her to be sure that everyone in the house is masked and Mr. Kesinger should be quarantined for ~ the next week (onset of Ss ~ 3 days ago).    Nicolasa Ducking, NP

## 2020-03-03 ENCOUNTER — Other Ambulatory Visit: Payer: Self-pay | Admitting: Oncology

## 2020-03-03 ENCOUNTER — Encounter: Payer: Self-pay | Admitting: Oncology

## 2020-03-03 ENCOUNTER — Ambulatory Visit (HOSPITAL_COMMUNITY)
Admission: RE | Admit: 2020-03-03 | Discharge: 2020-03-03 | Disposition: A | Discharge status: Home / Self Care | Payer: Medicaid Other | Source: Ambulatory Visit | Attending: Pulmonary Disease | Admitting: Pulmonary Disease

## 2020-03-03 DIAGNOSIS — U071 COVID-19: Secondary | ICD-10-CM

## 2020-03-03 MED ORDER — DIPHENHYDRAMINE HCL 50 MG/ML IJ SOLN: 50.0000 mg | Freq: Once | INTRAMUSCULAR | Status: DC | PRN

## 2020-03-03 MED ORDER — FAMOTIDINE IN NACL 20-0.9 MG/50ML-% IV SOLN: 20.0000 mg | Freq: Once | INTRAVENOUS | Status: DC | PRN

## 2020-03-03 MED ORDER — SODIUM CHLORIDE 0.9 % IV SOLN
1200.0000 mg | Freq: Once | INTRAVENOUS | Status: AC
  Administered 2020-03-03: 1200 mg via INTRAVENOUS

## 2020-03-03 MED ORDER — EPINEPHRINE 0.3 MG/0.3ML IJ SOAJ: 0.3000 mg | Freq: Once | INTRAMUSCULAR | Status: DC | PRN

## 2020-03-03 MED ORDER — METHYLPREDNISOLONE SODIUM SUCC 125 MG IJ SOLR: 125.0000 mg | Freq: Once | INTRAMUSCULAR | Status: DC | PRN

## 2020-03-03 MED ORDER — ALBUTEROL SULFATE HFA 108 (90 BASE) MCG/ACT IN AERS: 2.0000 | INHALATION_SPRAY | Freq: Once | RESPIRATORY_TRACT | Status: DC | PRN

## 2020-03-03 MED ORDER — SODIUM CHLORIDE 0.9 % IV SOLN: INTRAVENOUS | Status: DC | PRN

## 2020-03-03 NOTE — Discharge Instructions (Signed)

## 2020-03-03 NOTE — Progress Notes (Signed)
  Diagnosis: COVID-19  Physician: Dr. Wright  Procedure: Covid Infusion Clinic Med: casirivimab\imdevimab infusion - Provided patient with casirivimab\imdevimab fact sheet for patients, parents and caregivers prior to infusion.  Complications: No immediate complications noted.  Discharge: Discharged home   John Spence R Michiah Mudry 03/03/2020   

## 2020-03-03 NOTE — Progress Notes (Signed)
I connected by phone with  Mr. Teems to discuss the potential use of an new treatment for mild to moderate COVID-19 viral infection in non-hospitalized patients.   This patient is a age/sex that meets the FDA criteria for Emergency Use Authorization of casirivimab\imdevimab.  Has a (+) direct SARS-CoV-2 viral test result 1. Has mild or moderate COVID-19  2. Is ? 30 years of age and weighs ? 40 kg 3. Is NOT hospitalized due to COVID-19 4. Is NOT requiring oxygen therapy or requiring an increase in baseline oxygen flow rate due to COVID-19 5. Is within 10 days of symptom onset 6. Has at least one of the high risk factor(s) for progression to severe COVID-19 and/or hospitalization as defined in EUA. ? Specific high risk criteria : Obesity, SVI    Symptom onset  02/28/2020   I have spoken and communicated the following to the patient or parent/caregiver:   1. FDA has authorized the emergency use of casirivimab\imdevimab for the treatment of mild to moderate COVID-19 in adults and pediatric patients with positive results of direct SARS-CoV-2 viral testing who are 65 years of age and older weighing at least 40 kg, and who are at high risk for progressing to severe COVID-19 and/or hospitalization.   2. The significant known and potential risks and benefits of casirivimab\imdevimab, and the extent to which such potential risks and benefits are unknown.   3. Information on available alternative treatments and the risks and benefits of those alternatives, including clinical trials.   4. Patients treated with casirivimab\imdevimab should continue to self-isolate and use infection control measures (e.g., wear mask, isolate, social distance, avoid sharing personal items, clean and disinfect "high touch" surfaces, and frequent handwashing) according to CDC guidelines.    5. The patient or parent/caregiver has the option to accept or refuse casirivimab\imdevimab .   After reviewing this information with  the patient, The patient agreed to proceed with receiving casirivimab\imdevimab infusion and will be provided a copy of the Fact sheet prior to receiving the infusion.Mignon Pine, AGNP-C [phone] (Infusion Center Hotline)

## 2020-03-27 ENCOUNTER — Ambulatory Visit: Payer: Self-pay | Admitting: Gerontology

## 2020-05-06 ENCOUNTER — Telehealth: Payer: Self-pay | Admitting: Pharmacist

## 2020-05-06 NOTE — Telephone Encounter (Signed)
Patient failed to provide requested 2021 financial documentation. No additional medication assistance will be provided by [facility] without the required proof of income documentation. Patient notified by letter John Spence Administrative Assistant [facility] 

## 2020-12-24 IMAGING — CR DG ABDOMEN 2V
2 series · 2 of 2 positions shown · non-contrast
Comparison: None.

CLINICAL DATA: Acute mid abdominal pain.

EXAM:
ABDOMEN - 2 VIEW

[abdomen erect]
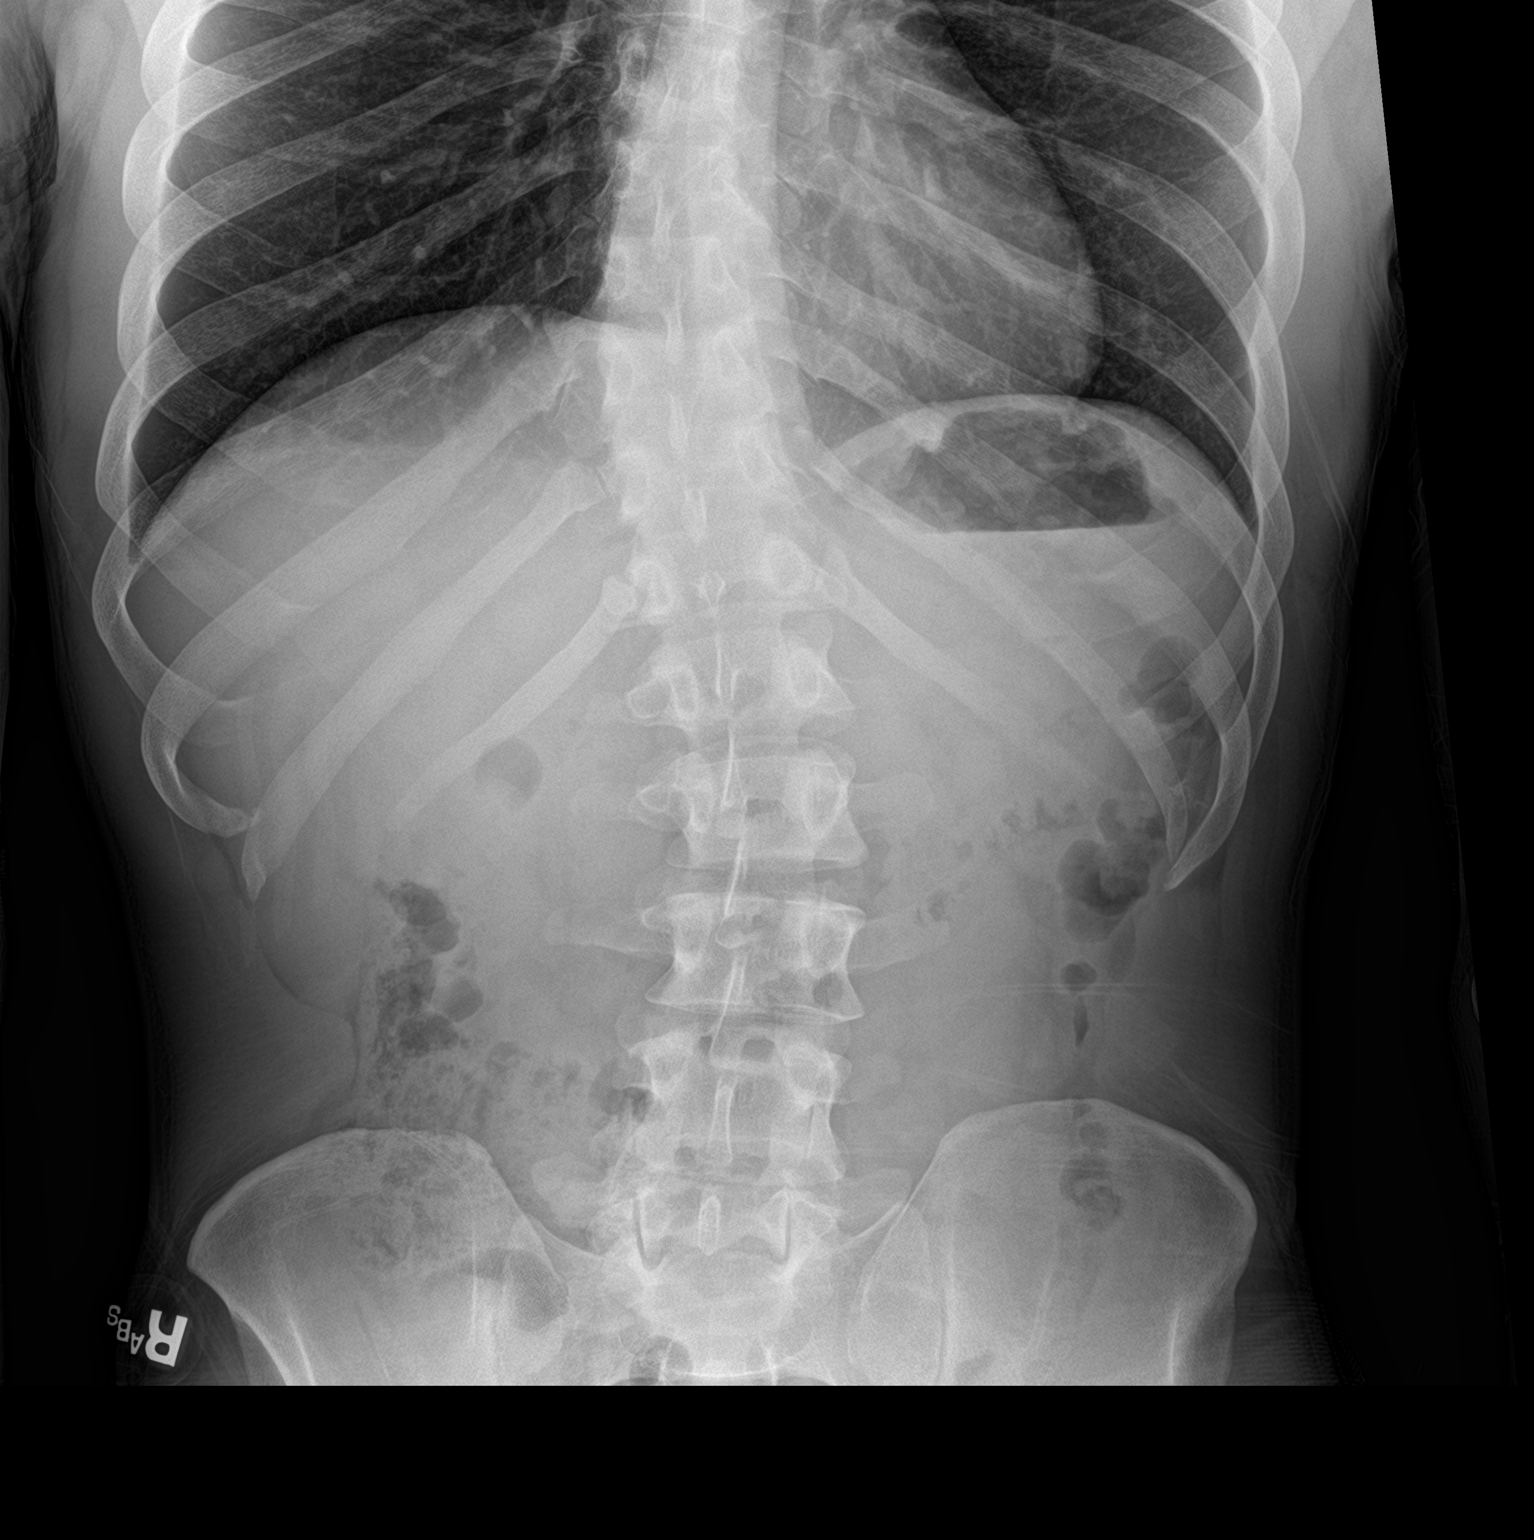

[abdomen supine]
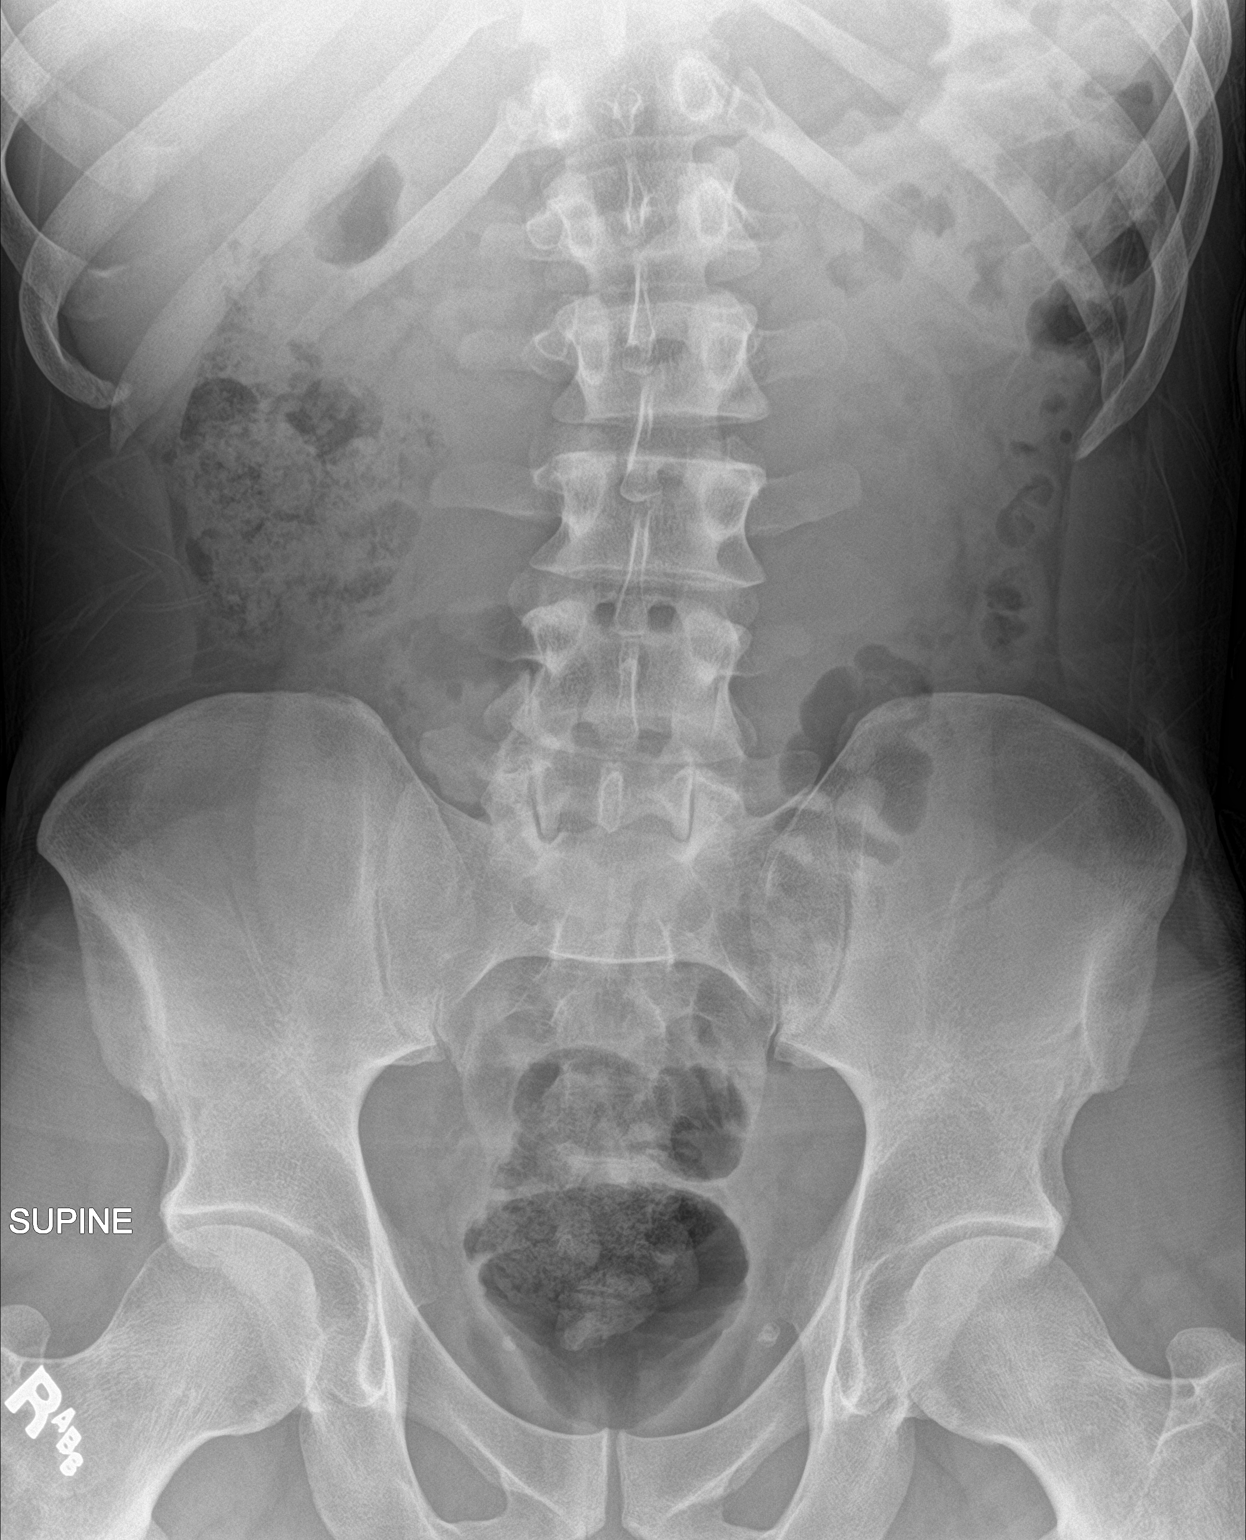

[2 of 2 positions shown; findings below may reference images not displayed]

FINDINGS: The bowel gas pattern is normal. There is no evidence of free air.
No radio-opaque calculi or other significant radiographic
abnormality is seen.
IMPRESSION: No evidence of bowel obstruction or ileus.

## 2021-03-16 IMAGING — DX PORTABLE CHEST - 1 VIEW
2 series · 2 of 2 positions shown · non-contrast
Comparison: None.

CLINICAL DATA: Shortness of breath 3 days

EXAM:
PORTABLE CHEST 1 VIEW

[chest ap (1 of 2)]
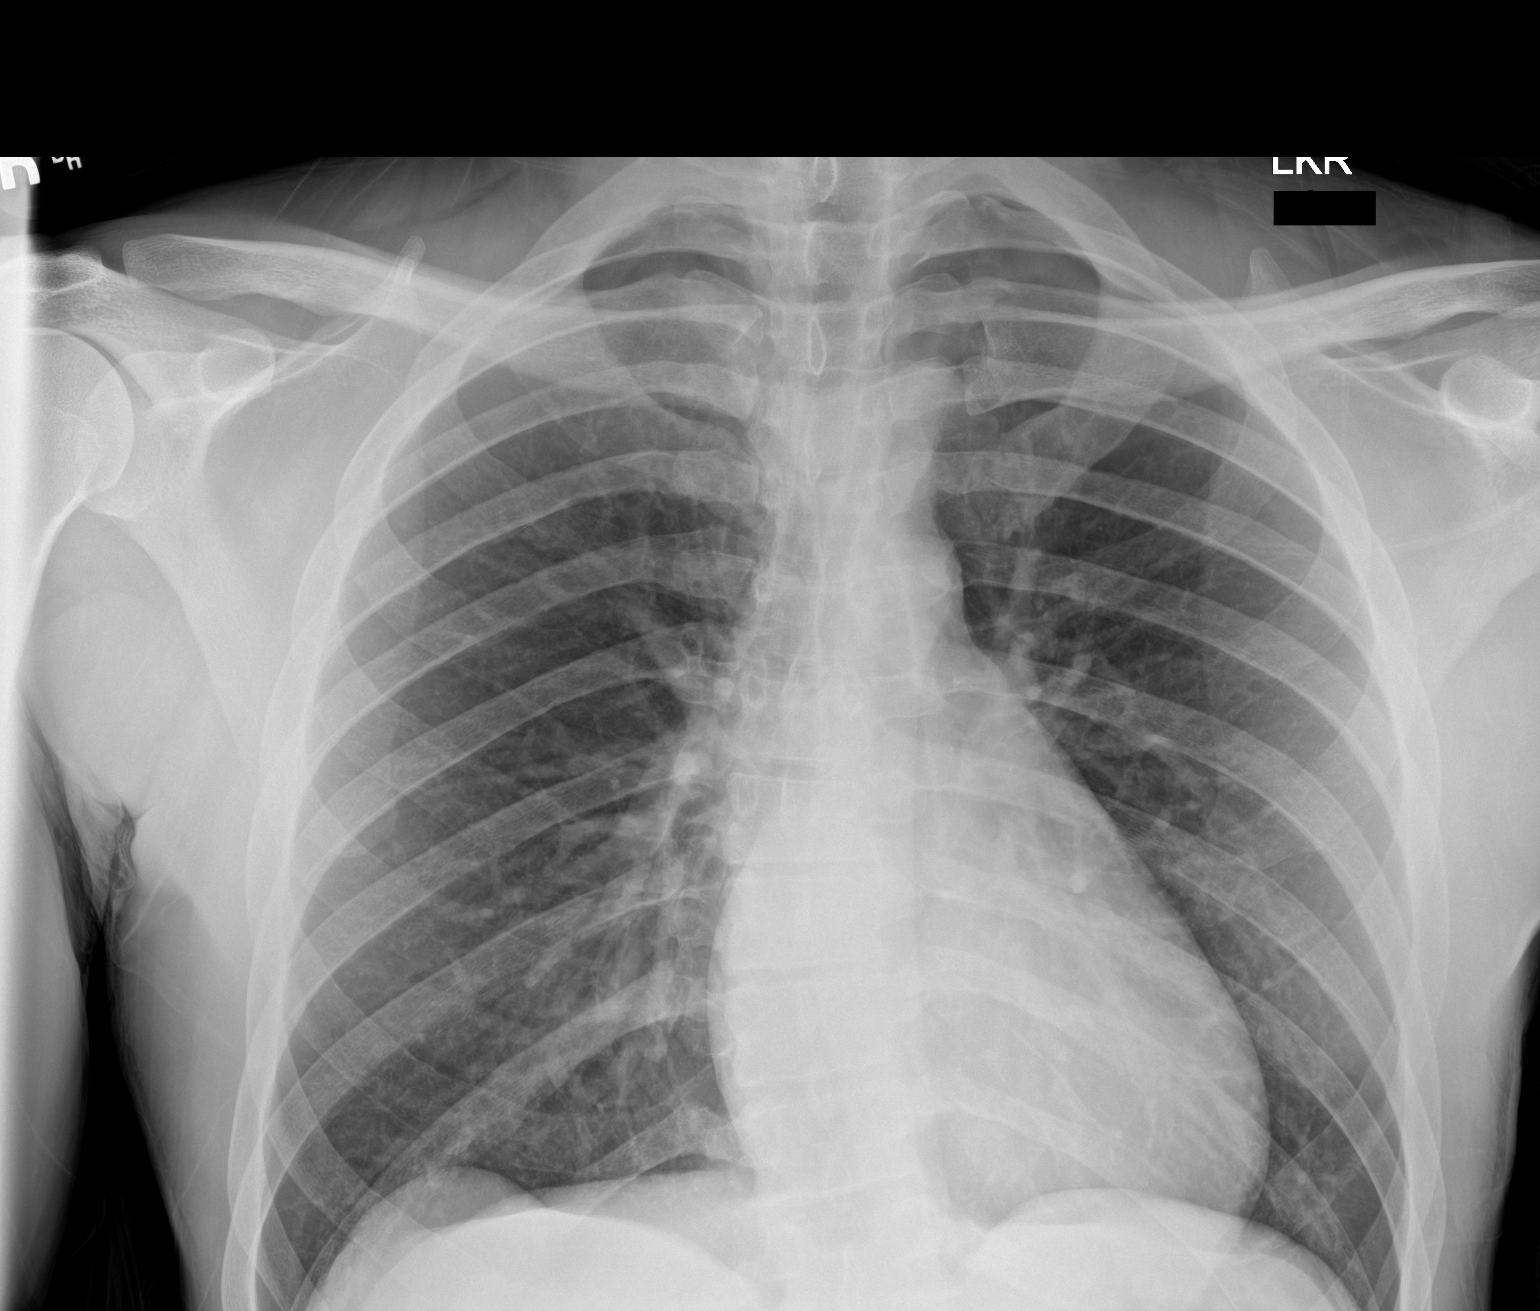

[chest ap (2 of 2)]
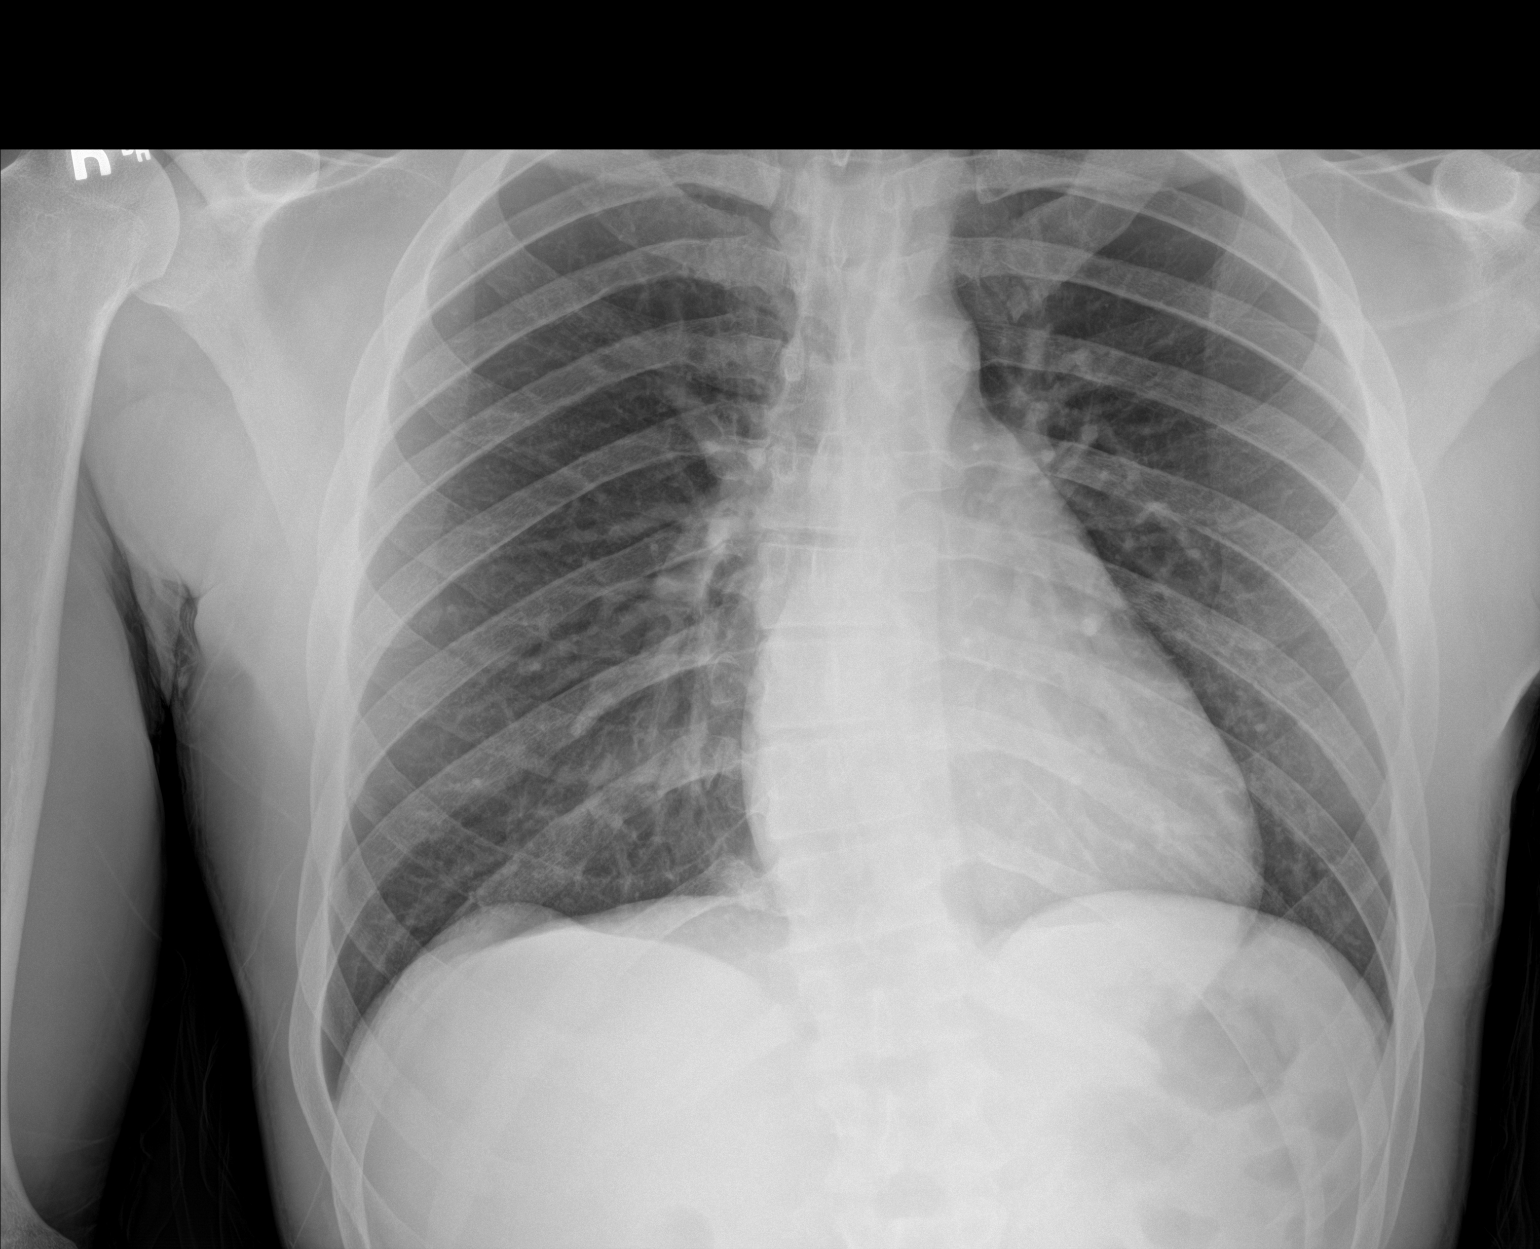

[2 of 2 positions shown; findings below may reference images not displayed]

FINDINGS: The heart size and mediastinal contours are within normal limits.
Both lungs are clear. The visualized skeletal structures are
unremarkable.
IMPRESSION: No active disease.

## 2021-11-28 ENCOUNTER — Other Ambulatory Visit: Payer: Self-pay

## 2021-11-28 ENCOUNTER — Emergency Department
Admission: EM | Admit: 2021-11-28 | Discharge: 2021-11-28 | Disposition: A | Discharge status: Home / Self Care | Payer: Medicaid Other | Attending: Emergency Medicine | Admitting: Emergency Medicine

## 2021-11-28 DIAGNOSIS — R369 Urethral discharge, unspecified: Secondary | ICD-10-CM | POA: Insufficient documentation

## 2021-11-28 DIAGNOSIS — Z202 Contact with and (suspected) exposure to infections with a predominantly sexual mode of transmission: Secondary | ICD-10-CM | POA: Insufficient documentation

## 2021-11-28 DIAGNOSIS — A64 Unspecified sexually transmitted disease: Secondary | ICD-10-CM | POA: Insufficient documentation

## 2021-11-28 DIAGNOSIS — R300 Dysuria: Secondary | ICD-10-CM | POA: Insufficient documentation

## 2021-11-28 LAB — CHLAMYDIA/NGC RT PCR (ARMC ONLY)
Chlamydia Tr: NOT DETECTED
N gonorrhoeae: NOT DETECTED

## 2021-11-28 LAB — URINALYSIS, ROUTINE W REFLEX MICROSCOPIC
Bilirubin Urine: NEGATIVE
Glucose, UA: NEGATIVE mg/dL
Hgb urine dipstick: NEGATIVE
Ketones, ur: NEGATIVE mg/dL
Leukocytes,Ua: NEGATIVE
Nitrite: NEGATIVE
Protein, ur: NEGATIVE mg/dL
Specific Gravity, Urine: 1.029 (ref 1.005–1.030)
pH: 6 (ref 5.0–8.0)

## 2021-11-28 MED ORDER — DOXYCYCLINE HYCLATE 100 MG PO TABS: 100.0000 mg | ORAL_TABLET | Freq: Two times a day (BID) | ORAL | 0 refills | Status: DC

## 2021-11-28 MED ORDER — CEFTRIAXONE SODIUM 1 G IJ SOLR
500.0000 mg | Freq: Once | INTRAMUSCULAR | Status: AC
  Administered 2021-11-28: 500 mg via INTRAMUSCULAR
  Filled 2021-11-28: qty 10

## 2021-11-28 NOTE — ED Provider Notes (Signed)
[facility] Provider Note  Patient Contact: 9:01 PM (approximate)   History   SEXUALLY TRANSMITTED DISEASE   HPI  John Spence is a 31 y.o. male who presents to the emergency department complaining of penile discharge and dysuria.  Symptoms have been ongoing for 2 days.  Patient denies any testicular pain or abdominal pain.  Patient's partner is being seen for similar complaints.  No nausea, vomiting, diarrhea.     Physical Exam   Triage Vital Signs: ED Triage Vitals  Enc Vitals Group     BP 11/28/21 1907 (!) 150/98     Pulse Rate 11/28/21 1907 93     Resp 11/28/21 1907 18     Temp 11/28/21 1907 98.9 F (37.2 C)     Temp Source 11/28/21 1907 Oral     SpO2 11/28/21 1907 98 %     Weight 11/28/21 1908 170 lb (77.1 kg)     Height 11/28/21 1908 6\' 1"  (1.854 m)     Head Circumference --      Peak Flow --      Pain Score 11/28/21 1908 0     Pain Loc --      Pain Edu? --      Excl. in GC? --     Most recent vital signs: Vitals:   11/28/21 1907  BP: (!) 150/98  Pulse: 93  Resp: 18  Temp: 98.9 F (37.2 C)  SpO2: 98%     General: Alert and in no acute distress.  Cardiovascular:  Good peripheral perfusion Respiratory: Normal respiratory effort without tachypnea or retractions. Lungs CTAB.  Urogenital: Patient declines Musculoskeletal: Full range of motion to all extremities.  Neurologic:  No gross focal neurologic deficits are appreciated.  Skin:   No rash noted Other:   ED Results / Procedures / Treatments   Labs (all labs ordered are listed, but only abnormal results are displayed) Labs Reviewed  URINALYSIS, ROUTINE W REFLEX MICROSCOPIC - Abnormal; Notable for the following components:      Result Value   Color, Urine YELLOW (*)    APPearance HAZY (*)    All other components within normal limits  CHLAMYDIA/NGC RT PCR ([facility] ONLY)               EKG     RADIOLOGY    No results found.  PROCEDURES:  Critical Care  performed: No  Procedures   MEDICATIONS ORDERED IN ED: Medications  cefTRIAXone (ROCEPHIN) injection 500 mg (has no administration in time range)     IMPRESSION / MDM / ASSESSMENT AND PLAN / ED COURSE  I reviewed the triage vital signs and the nursing notes.                              Differential diagnosis includes, but is not limited to, STD, gonorrhea, chlamydia, nongonococcal urethritis, UTI  Patient's presentation is most consistent with acute illness / injury with system symptoms.   Patient's diagnosis is consistent with exposure to STD.  Patient presents to the ED with symptoms consistent with an STD.  Patient will have gonorrhea and Chlamydia testing but pending results patient will be treated empirically with Rocephin and doxycycline.  Patient will follow results on MyChart.  Return precautions discussed with the patient.  No indication for further work-up at this time.. Patient is given ED precautions to return to the ED for any worsening or new symptoms.  FINAL CLINICAL IMPRESSION(S) / ED DIAGNOSES   Final diagnoses:  Exposure to STD     Rx / DC Orders   ED Discharge Orders          Ordered    doxycycline (VIBRA-TABS) 100 MG tablet  2 times daily        11/28/21 2121             Note:  This document was prepared using Dragon voice recognition software and may include unintentional dictation errors.   Lanette Hampshire 11/28/21 2121    Gilles Chiquito, MD 11/29/21 [phone]

## 2021-11-28 NOTE — ED Triage Notes (Signed)
Ambulatory to triage with c/o " I think I have an STD." Sx began yesterday. Reports clear watery discharge " but only when I pull on it."  burning on urination. Denies fever.

## 2022-02-01 ENCOUNTER — Emergency Department
Admission: EM | Admit: 2022-02-01 | Discharge: 2022-02-01 | Disposition: A | Discharge status: Home / Self Care | Payer: Medicaid Other | Attending: Emergency Medicine | Admitting: Emergency Medicine

## 2022-02-01 ENCOUNTER — Encounter: Payer: Self-pay | Admitting: Intensive Care

## 2022-02-01 ENCOUNTER — Other Ambulatory Visit: Payer: Self-pay

## 2022-02-01 DIAGNOSIS — H1012 Acute atopic conjunctivitis, left eye: Secondary | ICD-10-CM | POA: Insufficient documentation

## 2022-02-01 MED ORDER — AZELASTINE HCL 0.05 % OP SOLN: 1.0000 [drp] | Freq: Two times a day (BID) | OPHTHALMIC | 0 refills | Status: DC

## 2022-02-01 MED ORDER — CIPROFLOXACIN HCL 0.3 % OP SOLN: 1.0000 [drp] | OPHTHALMIC | 0 refills | Status: DC

## 2022-02-01 NOTE — ED Triage Notes (Signed)
Presents with left eye itchiness, drainage and pink in color. Symptoms started yesterday

## 2022-02-01 NOTE — ED Provider Notes (Signed)
[facility] Provider Note  Patient Contact: 4:28 PM (approximate)   History   Conjunctivitis   HPI  John Spence is a 31 y.o. male  Who presents the emergency department complaining of left eye irritation.  Patient has had itching, watery eye to the left side.  Was worse yesterday, was getting better today.  Patient does wear contacts, took did not yesterday.  No pain to the eye.  No purulent drainage.     Physical Exam   Triage Vital Signs: ED Triage Vitals [02/01/22 1623]  Enc Vitals Group     BP (!) 149/96     Pulse Rate 77     Resp 16     Temp 98.5 F (36.9 C)     Temp Source Oral     SpO2 98 %     Weight 170 lb (77.1 kg)     Height 6' (1.829 m)     Head Circumference      Peak Flow      Pain Score 5     Pain Loc      Pain Edu?      Excl. in GC?     Most recent vital signs: Vitals:   02/01/22 1623  BP: (!) 149/96  Pulse: 77  Resp: 16  Temp: 98.5 F (36.9 C)  SpO2: 98%     General: Alert and in no acute distress. Eyes:  PERRL. EOMI. visualization of the left eye reveals erythema of the conjunctive the left side.  No drainage noted.  Funduscopic exam is unremarkable bilaterally.  Cardiovascular:  Good peripheral perfusion Respiratory: Normal respiratory effort without tachypnea or retractions. Lungs CTAB.  Musculoskeletal: Full range of motion to all extremities.  Neurologic:  No gross focal neurologic deficits are appreciated.  Skin:   No rash noted Other:   ED Results / Procedures / Treatments   Labs (all labs ordered are listed, but only abnormal results are displayed) Labs Reviewed - No data to display   EKG     RADIOLOGY    No results found.  PROCEDURES:  Critical Care performed: No  Procedures   MEDICATIONS ORDERED IN ED: Medications - No data to display   IMPRESSION / MDM / ASSESSMENT AND PLAN / ED COURSE  I reviewed the triage vital signs and the nursing notes.                               Differential diagnosis includes, but is not limited to, conjunctivitis (bacterial, viral, allergic, chemical), corneal ulcer  Patient's presentation is most consistent with acute presentation with potential threat to life or bodily function.   Patient's diagnosis is consistent with allergic conjunctivitis.  Patient presents to the ED complaining of itching and watery eye to the left side.  Was worse yesterday, getting better.  Patient does wear contacts.  Physical exam was consistent with allergic conjunctivitis but will treat for allergic and bacterial given the fact that he wears contacts.  He is instructed to throw away his previous contacts.  Do not wear contacts for the next week.  Follow-up with ophthalmology as needed.  Return precautions discussed with the patient. Patient is given ED precautions to return to the ED for any worsening or new symptoms.        FINAL CLINICAL IMPRESSION(S) / ED DIAGNOSES   Final diagnoses:  Allergic conjunctivitis of left eye     Rx / DC  Orders   ED Discharge Orders          Ordered    azelastine (OPTIVAR) 0.05 % ophthalmic solution  2 times daily        02/01/22 1630    ciprofloxacin (CILOXAN) 0.3 % ophthalmic solution  Every 4 hours while awake        02/01/22 1630             Note:  This document was prepared using Dragon voice recognition software and may include unintentional dictation errors.   Lanette Hampshire 02/01/22 1633    Shaune Pollack, MD 02/01/22 [phone]

## 2022-02-17 ENCOUNTER — Emergency Department
Admission: EM | Admit: 2022-02-17 | Discharge: 2022-02-17 | Disposition: A | Discharge status: Home / Self Care | Payer: Self-pay | Attending: Emergency Medicine | Admitting: Emergency Medicine

## 2022-02-17 ENCOUNTER — Other Ambulatory Visit: Payer: Self-pay

## 2022-02-17 ENCOUNTER — Encounter: Payer: Self-pay | Admitting: Emergency Medicine

## 2022-02-17 DIAGNOSIS — N341 Nonspecific urethritis: Secondary | ICD-10-CM | POA: Insufficient documentation

## 2022-02-17 DIAGNOSIS — N342 Other urethritis: Secondary | ICD-10-CM

## 2022-02-17 LAB — URINALYSIS, ROUTINE W REFLEX MICROSCOPIC
Bilirubin Urine: NEGATIVE
Glucose, UA: NEGATIVE mg/dL
Hgb urine dipstick: NEGATIVE
Ketones, ur: NEGATIVE mg/dL
Leukocytes,Ua: NEGATIVE
Nitrite: NEGATIVE
Protein, ur: NEGATIVE mg/dL
Specific Gravity, Urine: 1.025 (ref 1.005–1.030)
pH: 7 (ref 5.0–8.0)

## 2022-02-17 LAB — CHLAMYDIA/NGC RT PCR (ARMC ONLY)
Chlamydia Tr: NOT DETECTED
N gonorrhoeae: NOT DETECTED

## 2022-02-17 MED ORDER — METRONIDAZOLE 500 MG PO TABS
2000.0000 mg | ORAL_TABLET | Freq: Once | ORAL | Status: AC
  Administered 2022-02-17: 2000 mg via ORAL
  Filled 2022-02-17: qty 4

## 2022-02-17 MED ORDER — MOXIFLOXACIN HCL 400 MG PO TABS: 400.0000 mg | ORAL_TABLET | Freq: Every day | ORAL | 0 refills | Status: AC

## 2022-02-17 MED ORDER — MOXIFLOXACIN HCL 400 MG PO TABS
400.0000 mg | ORAL_TABLET | Freq: Every day | ORAL | 0 refills | Status: DC
  Filled 2022-02-17: qty 7, 7d supply, fill #0

## 2022-02-17 MED ORDER — CEFTRIAXONE SODIUM 1 G IJ SOLR
500.0000 mg | Freq: Once | INTRAMUSCULAR | Status: AC
  Administered 2022-02-17: 500 mg via INTRAMUSCULAR
  Filled 2022-02-17: qty 10

## 2022-02-17 MED ORDER — AZITHROMYCIN 1 G PO PACK
1.0000 g | PACK | Freq: Once | ORAL | Status: AC
  Administered 2022-02-17: 1 g via ORAL
  Filled 2022-02-17: qty 1

## 2022-02-17 NOTE — ED Triage Notes (Signed)
Pt via POV from home. Pt states he was here a month ago for STD testing, pt was prophylactic abx but states everything came back negative. Pt completed antibiotics. States the "whitish clear discharge" then came back after his completion of the abx. No new sexual partners. States he does have some dysuria. Pt is A&Ox4 and NAD.

## 2022-02-17 NOTE — ED Provider Triage Note (Signed)
Emergency Medicine Provider Triage Evaluation Note  John Spence , a 31 y.o. male  was evaluated in triage.  Pt complains of penile discharge, clearish-white. Came one month ago and was discharged, had STD testing which was negative but was put on abx and discharge cleared up. Now it has come back. +dysuria. No abd pain. No fever. Sexually active with same partner, partner also tested negative. No testicular pain or swelling. Denies concern for STD.  Review of Systems  Positive: Penile discharge Negative: Testicular pain or swelling  Physical Exam  There were no vitals taken for this visit. Gen:   Awake, no distress   Resp:  Normal effort  MSK:   Moves extremities without difficulty  Other:    Medical Decision Making  Medically screening exam initiated at 4:31 PM.  Appropriate orders placed.  My L Scism was informed that the remainder of the evaluation will be completed by another provider, this initial triage assessment does not replace that evaluation, and the importance of remaining in the ED until their evaluation is complete.     Jackelyn Hoehn, PA-C 02/17/22 1635

## 2022-02-17 NOTE — ED Provider Notes (Signed)
[facility] Provider Note    Event Date/Time   First MD Initiated Contact with Patient 02/17/22 1733     (approximate)   History   Chief Complaint Penile Discharge   HPI  John Spence is a 31 y.o. male with no significant past medical history who presents to the ED complaining of penile discharge.  Patient reports that he has been dealing with thin whitish discharge from his penis for about the past 24 hours.  He reports associated burning discomfort, especially when he urinates.  He has not noticed any bleeding and denies any lesions to his penis or groin, has not noticed any swollen lymph nodes.  He denies any fevers.  He states he was previously treated for sexually transmitted diseases a couple of months ago, but testing came back negative at that time.  He has been sexually active with the same male partner for the past 8 months, states his partner was treated at the same time he was a few months ago.  He does state he was recently tested for HIV and that came back negative.     Physical Exam   Triage Vital Signs: ED Triage Vitals  Enc Vitals Group     BP 02/17/22 1638 (!) 142/92     Pulse Rate 02/17/22 1638 72     Resp 02/17/22 1638 18     Temp 02/17/22 1638 98.4 F (36.9 C)     Temp Source 02/17/22 1638 Oral     SpO2 02/17/22 1638 96 %     Weight 02/17/22 1634 170 lb (77.1 kg)     Height 02/17/22 1634 6' (1.829 m)     Head Circumference --      Peak Flow --      Pain Score 02/17/22 1633 0     Pain Loc --      Pain Edu? --      Excl. in GC? --     Most recent vital signs: Vitals:   02/17/22 1638  BP: (!) 142/92  Pulse: 72  Resp: 18  Temp: 98.4 F (36.9 C)  SpO2: 96%    Constitutional: Alert and oriented. Eyes: Conjunctivae are normal. Head: Atraumatic. Nose: No congestion/rhinnorhea. Mouth/Throat: Mucous membranes are moist.  Cardiovascular: Normal rate, regular rhythm. Grossly normal heart sounds.  2+ radial pulses  bilaterally. Respiratory: Normal respiratory effort.  No retractions. Lungs CTAB. Gastrointestinal: Soft and nontender. No distention. Genitourinary: No testicular or penile tenderness noted, no skin lesions or lymphadenopathy noted. Musculoskeletal: No lower extremity tenderness nor edema.  Neurologic:  Normal speech and language. No gross focal neurologic deficits are appreciated.    ED Results / Procedures / Treatments   Labs (all labs ordered are listed, but only abnormal results are displayed) Labs Reviewed  URINALYSIS, ROUTINE W REFLEX MICROSCOPIC - Abnormal; Notable for the following components:      Result Value   Color, Urine YELLOW (*)    APPearance CLEAR (*)    All other components within normal limits  CHLAMYDIA/NGC RT PCR ([facility] ONLY)            URINE CULTURE  WET PREP, GENITAL    PROCEDURES:  Critical Care performed: No  Procedures   MEDICATIONS ORDERED IN ED: Medications  cefTRIAXone (ROCEPHIN) injection 500 mg (500 mg Intramuscular Given 02/17/22 1845)  metroNIDAZOLE (FLAGYL) tablet 2,000 mg (2,000 mg Oral Given 02/17/22 1835)  azithromycin (ZITHROMAX) powder 1 g (1 g Oral Given 02/17/22 1834)  IMPRESSION / MDM / ASSESSMENT AND PLAN / ED COURSE  I reviewed the triage vital signs and the nursing notes.                              31 y.o. male with no significant past medical history presents to the ED with 24 hours of thin whitish penile discharge associated with dysuria.  Patient's presentation is most consistent with acute complicated illness / injury requiring diagnostic workup.  Differential diagnosis includes, but is not limited to, urethritis, gonorrhea, chlamydia, syphilis, trichomoniasis.  Patient nontoxic-appearing and in no acute distress, vital signs are unremarkable.  Examination of his groin is unremarkable with no skin lesions or tenderness noted.  He previously received Rocephin and doxycycline for STD treatment, we will again cover with  Rocephin today, give dose of Flagyl to cover for trichomoniasis and azithromycin for chlamydia.  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  He was counseled to return to the ED for new or worsening symptoms, patient agrees with plan.      FINAL CLINICAL IMPRESSION(S) / ED DIAGNOSES   Final diagnoses:  Urethritis     Rx / DC Orders   ED Discharge Orders          Ordered    moxifloxacin (AVELOX) 400 MG tablet  Daily        02/17/22 1859             Note:  This document was prepared using Dragon voice recognition software and may include unintentional dictation errors.   Chesley Noon, MD 02/17/22 [phone]

## 2022-02-18 ENCOUNTER — Other Ambulatory Visit: Payer: Self-pay

## 2022-02-18 LAB — URINE CULTURE: Culture: NO GROWTH

## 2023-02-01 ENCOUNTER — Other Ambulatory Visit: Payer: Self-pay

## 2023-02-01 ENCOUNTER — Emergency Department
Admission: EM | Admit: 2023-02-01 | Discharge: 2023-02-01 | Disposition: A | Discharge status: Home / Self Care | Payer: 59 | Attending: Emergency Medicine | Admitting: Emergency Medicine

## 2023-02-01 DIAGNOSIS — F40248 Other situational type phobia: Secondary | ICD-10-CM | POA: Insufficient documentation

## 2023-02-01 DIAGNOSIS — F418 Other specified anxiety disorders: Secondary | ICD-10-CM

## 2023-02-01 DIAGNOSIS — R630 Anorexia: Secondary | ICD-10-CM | POA: Diagnosis not present

## 2023-02-01 DIAGNOSIS — Z113 Encounter for screening for infections with a predominantly sexual mode of transmission: Secondary | ICD-10-CM | POA: Diagnosis not present

## 2023-02-01 LAB — URINALYSIS, COMPLETE (UACMP) WITH MICROSCOPIC
Bacteria, UA: NONE SEEN
Bilirubin Urine: NEGATIVE
Glucose, UA: NEGATIVE mg/dL
Hgb urine dipstick: NEGATIVE
Ketones, ur: NEGATIVE mg/dL
Leukocytes,Ua: NEGATIVE
Nitrite: NEGATIVE
Protein, ur: 30 mg/dL — AB
Specific Gravity, Urine: 1.03 (ref 1.005–1.030)
Squamous Epithelial / HPF: NONE SEEN /HPF (ref 0–5)
pH: 5 (ref 5.0–8.0)

## 2023-02-01 LAB — HIV ANTIBODY (ROUTINE TESTING W REFLEX): HIV Screen 4th Generation wRfx: NONREACTIVE

## 2023-02-01 MED ORDER — LORAZEPAM 0.5 MG PO TABS: 0.5000 mg | ORAL_TABLET | Freq: Two times a day (BID) | ORAL | 0 refills | Status: DC

## 2023-02-01 NOTE — Discharge Instructions (Signed)
Be able to see the results of your test on MyChart.  A prescription for anxiety was also sent to the pharmacy for you begin taking as needed.  Do not drive or operate machinery if this medication causes any drowsiness.

## 2023-02-01 NOTE — ED Triage Notes (Signed)
Pt to ED d/t partner testing positive for  HIV.

## 2023-02-01 NOTE — ED Provider Notes (Signed)
[facility] Provider Note    Event Date/Time   First MD Initiated Contact with Patient 02/01/23 [phone]     (approximate)   History   Exposure to STD   HPI  John Spence is a 32 y.o. male presents to the ED for STI screening.  Patient reports that his partner was recently diagnosed after being seen in the ED for HIV.  Patient denies any symptoms.  He also reports a lot of anxiety, decreased appetite and decreased sleeping secondary to this news.  Partner is also present with patient confirming the results of his test.     Physical Exam   Triage Vital Signs: ED Triage Vitals  Encounter Vitals Group     BP 02/01/23 0858 (!) 139/93     Systolic BP Percentile --      Diastolic BP Percentile --      Pulse Rate 02/01/23 0858 76     Resp 02/01/23 0858 16     Temp 02/01/23 0857 98.3 F (36.8 C)     Temp Source 02/01/23 0857 Oral     SpO2 02/01/23 0858 100 %     Weight 02/01/23 0858 175 lb (79.4 kg)     Height 02/01/23 0858 5\' 11"  (1.803 m)     Head Circumference --      Peak Flow --      Pain Score 02/01/23 0858 0     Pain Loc --      Pain Education --      Exclude from Growth Chart --     Most recent vital signs: Vitals:   02/01/23 0857 02/01/23 0858  BP:  (!) 139/93  Pulse:  76  Resp:  16  Temp: 98.3 F (36.8 C)   SpO2:  100%     General: Awake, no distress.  CV:  Good peripheral perfusion.  Resp:  Normal effort.  Abd:  No distention.  Other:     ED Results / Procedures / Treatments   Labs (all labs ordered are listed, but only abnormal results are displayed) Labs Reviewed  URINALYSIS, COMPLETE (UACMP) WITH MICROSCOPIC - Abnormal; Notable for the following components:      Result Value   Color, Urine YELLOW (*)    APPearance CLEAR (*)    Protein, ur 30 (*)    All other components within normal limits  RPR  HIV ANTIBODY (ROUTINE TESTING W REFLEX)      PROCEDURES:  Critical Care performed:    Procedures   MEDICATIONS ORDERED IN ED: Medications - No data to display   IMPRESSION / MDM / ASSESSMENT AND PLAN / ED COURSE  I reviewed the triage vital signs and the nursing notes.   Differential diagnosis includes, but is not limited to, STI screening, possible exposure HIV, situational anxiety.  32 year old male presents to the ED with anxiety and concerns for possible HIV infection.  Patient's partner tested reactive.  Lab test were drawn and patient is aware that the results will be seen on MyChart.  A short-term prescription for lorazepam 0.5 mg 1 twice daily as needed for anxiety was sent to the pharmacy to take as needed.  Patient is aware that this could cause drowsiness and increase his risk for injury.      Patient's presentation is most consistent with acute complicated illness / injury requiring diagnostic workup.  FINAL CLINICAL IMPRESSION(S) / ED DIAGNOSES   Final diagnoses:  Screen for STD (sexually transmitted disease)  Situational anxiety  Rx / DC Orders   ED Discharge Orders          Ordered    LORazepam (ATIVAN) 0.5 MG tablet  2 times daily        02/01/23 1039             Note:  This document was prepared using Dragon voice recognition software and may include unintentional dictation errors.   Tommi Rumps, PA-C 02/01/23 1110    Trinna Post, MD 02/03/23 [phone]

## 2023-02-01 NOTE — ED Notes (Signed)
Pt. To ED for HIV testing. Pt. States his partner tested positive for HIV this week, and he wants to be tested. Pt. Denies any symptoms, rashes etc.

## 2023-02-02 LAB — RPR: RPR Ser Ql: NONREACTIVE

## 2023-03-01 ENCOUNTER — Ambulatory Visit: Payer: 59 | Admitting: Infectious Diseases

## 2023-03-10 ENCOUNTER — Ambulatory Visit: Payer: Self-pay | Admitting: Infectious Diseases

## 2023-03-17 ENCOUNTER — Ambulatory Visit: Payer: Medicaid Other | Attending: Infectious Diseases | Admitting: Infectious Diseases

## 2023-03-17 ENCOUNTER — Other Ambulatory Visit
Admission: RE | Admit: 2023-03-17 | Discharge: 2023-03-17 | Disposition: A | Discharge status: Home / Self Care | Payer: Medicaid Other | Attending: Infectious Diseases | Admitting: Infectious Diseases

## 2023-03-17 ENCOUNTER — Encounter: Payer: Self-pay | Admitting: Infectious Diseases

## 2023-03-17 VITALS — BP 160/81 | HR 81 | Temp 97.7°F | Ht 71.0 in | Wt 174.0 lb

## 2023-03-17 DIAGNOSIS — Z9189 Other specified personal risk factors, not elsewhere classified: Secondary | ICD-10-CM | POA: Insufficient documentation

## 2023-03-17 DIAGNOSIS — Z206 Contact with and (suspected) exposure to human immunodeficiency virus [HIV]: Secondary | ICD-10-CM | POA: Diagnosis present

## 2023-03-17 LAB — COMPREHENSIVE METABOLIC PANEL
ALT: 43 U/L (ref 0–44)
AST: 23 U/L (ref 15–41)
Albumin: 4.3 g/dL (ref 3.5–5.0)
Alkaline Phosphatase: 73 U/L (ref 38–126)
Anion gap: 12 (ref 5–15)
BUN: 14 mg/dL (ref 6–20)
CO2: 23 mmol/L (ref 22–32)
Calcium: 9 mg/dL (ref 8.9–10.3)
Chloride: 104 mmol/L (ref 98–111)
Creatinine, Ser: 0.92 mg/dL (ref 0.61–1.24)
GFR, Estimated: >60 mL/min (ref >60)
Glucose, Bld: 101 mg/dL — ABNORMAL HIGH (ref 70–99)
Potassium: 3.5 mmol/L (ref 3.5–5.1)
Sodium: 139 mmol/L (ref 135–145)
Total Bilirubin: 0.8 mg/dL (ref 0.3–1.2)
Total Protein: 7.3 g/dL (ref 6.5–8.1)

## 2023-03-17 LAB — HEPATITIS A ANTIBODY, TOTAL: hep A Total Ab: REACTIVE — AB

## 2023-03-17 LAB — HEPATITIS B CORE ANTIBODY, TOTAL: Hep B Core Total Ab: NONREACTIVE

## 2023-03-17 LAB — CBC WITH DIFFERENTIAL/PLATELET
Abs Immature Granulocytes: 0.01 10*3/uL (ref 0.00–0.07)
Basophils Absolute: 0.1 10*3/uL (ref 0.0–0.1)
Basophils Relative: 1 %
Eosinophils Absolute: 0.1 10*3/uL (ref 0.0–0.5)
Eosinophils Relative: 2 %
HCT: 44.2 % (ref 39.0–52.0)
Hemoglobin: 14.8 g/dL (ref 13.0–17.0)
Immature Granulocytes: 0 %
Lymphocytes Relative: 28 %
Lymphs Abs: 2 10*3/uL (ref 0.7–4.0)
MCH: 32.1 pg (ref 26.0–34.0)
MCHC: 33.5 g/dL (ref 30.0–36.0)
MCV: 95.9 fL (ref 80.0–100.0)
Monocytes Absolute: 0.5 10*3/uL (ref 0.1–1.0)
Monocytes Relative: 7 %
Neutro Abs: 4.6 10*3/uL (ref 1.7–7.7)
Neutrophils Relative %: 62 %
Platelets: 267 10*3/uL (ref 150–400)
RBC: 4.61 MIL/uL (ref 4.22–5.81)
RDW: 13.6 % (ref 11.5–15.5)
WBC: 7.3 10*3/uL (ref 4.0–10.5)
nRBC: 0 % (ref 0.0–0.2)

## 2023-03-17 LAB — CHLAMYDIA/NGC RT PCR (ARMC ONLY)
Chlamydia Tr: NOT DETECTED
Chlamydia Tr: NOT DETECTED
N gonorrhoeae: NOT DETECTED
N gonorrhoeae: NOT DETECTED

## 2023-03-17 LAB — HIV ANTIBODY (ROUTINE TESTING W REFLEX): HIV Screen 4th Generation wRfx: NONREACTIVE

## 2023-03-17 LAB — HEPATITIS C ANTIBODY: HCV Ab: NONREACTIVE

## 2023-03-17 LAB — HEPATITIS B SURFACE ANTIGEN: Hepatitis B Surface Ag: NONREACTIVE

## 2023-03-17 NOTE — Patient Instructions (Signed)
You are here for PREP visit- today will do labs and then start on truvada once a day.

## 2023-03-17 NOTE — Progress Notes (Signed)
ID PREP Visit NAME: John Spence  DOB: 1990-07-05  MRN: [id number]  Date/Time: 03/17/2023 9:13 AM Subjective:   PreP visit John Spence is a 32 y.o. with no PMH Here to discuss PreP Risk-MSM Partners in the past 6 months-1 Partner is HIV positive newly diagnosed and is on HAART Unprotected oral sex  PMH none PSH none  Social History   Socioeconomic History   Marital status: Single    Spouse name: Not on file   Number of children: Not on file   Years of education: Not on file   Highest education level: Not on file  Occupational History   Not on file  Tobacco Use   Smoking status: Every Day    Current packs/day: 1.00    Types: Cigarettes   Smokeless tobacco: Never   Tobacco comments:    down to 2 cigarettes  Vaping Use   Vaping status: Never Used  Substance and Sexual Activity   Alcohol use: Yes    Comment: Occasional   Drug use: Yes    Types: Marijuana   Sexual activity: Yes    Birth control/protection: Condom  Other Topics Concern   Not on file  Social History Narrative   Not on file   Social Determinants of Health   Financial Resource Strain: Not on file  Food Insecurity: Not on file  Transportation Needs: Not on file  Physical Activity: Not on file  Stress: Not on file  Social Connections: Not on file  Intimate Partner Violence: Not on file    FH Mom -HTN Grandfather- lung cancer, DM No Known Allergies ? Current Outpatient Medications  Medication Sig Dispense Refill   LORazepam (ATIVAN) 0.5 MG tablet Take 1 tablet (0.5 mg total) by mouth 2 (two) times daily. 8 tablet 0   No current facility-administered medications for this visit.    REVIEW OF SYSTEMS:  Const: negative fever, negative chills, negative weight loss Eyes: negative diplopia or visual changes, negative eye pain ENT: negative coryza, negative sore throat Resp: negative cough, hemoptysis, dyspnea Cards: negative for chest pain, palpitations, lower extremity edema GU: negative  for frequency, dysuria and hematuria Skin: negative for rash and pruritus Heme: negative for easy bruising and gum/nose bleeding MS: negative for myalgias, arthralgias, back pain and muscle weakness Neurolo:negative for headaches, dizziness, vertigo, memory problems  Psych: feelings of anxiety  ? Objective:  VITALS:  BP (!) 160/81   Pulse 81   Temp 97.7 F (36.5 C) (Temporal)   Ht 5\' 11"  (1.803 m)   Wt 174 lb (78.9 kg)   BMI 24.27 kg/m   General: Alert, cooperative, no distress, appears stated age.  Head: Normocephalic, without obvious abnormality, atraumatic. Eyes: Conjunctivae clear, anicteric sclerae. Pupils are equal Nose: Nares normal. No drainage or sinus tenderness. Throat: Lips, mucosa, and tongue normal. No Thrush Neck: Supple, symmetrical, no adenopathy, thyroid: non tender no carotid bruit and no JVD. Back: No CVA tenderness. Lungs: Clear to auscultation bilaterally. No Wheezing or Rhonchi. No rales. Heart: Regular rate and rhythm, no murmur, rub or gallop. Abdomen: Soft, non-tender,not distended. Bowel sounds normal. No masses Extremities: Extremities normal, atraumatic, no cyanosis. No edema. No clubbing Skin: No rashes or lesions. Not Jaundiced Lymph: Cervical, supraclavicular normal. Neurologic: Grossly non-focal Pertinent Labs ? Pre-Prescription  HIV CMP HBV serology ( HBsag, S ab , core antibody) HAV HCV RPR/ GC/CHL UA Impression/Recommendation Here to intiate Pre exposure prophylaxis for HIV with truvada Discussed his risk factors, efficacy of PrEP, condoms to prevent other  STDS, adherence, side effects of truvada , need to wait for 20 days after starting truvada if he has to engage in insertive anal sex  Discussed how to recognize newly acquired HIv infection Baseline labs HIV-NR, HEP B immunity, creatinine, LFTS N Will do RPR, GC chl  HepC and HEp A  Discussed vaccination- HPV, and hepatitis vaccine Will send prescription for Truvada  once the  labs are available and HIV negative  Discussed the management with the patient in detail Follow up in 3 months

## 2023-03-18 ENCOUNTER — Other Ambulatory Visit: Payer: Self-pay

## 2023-03-18 ENCOUNTER — Telehealth: Payer: Self-pay

## 2023-03-18 ENCOUNTER — Encounter: Payer: Self-pay | Admitting: Infectious Diseases

## 2023-03-18 LAB — RPR: RPR Ser Ql: NONREACTIVE

## 2023-03-18 LAB — HEPATITIS B SURFACE ANTIBODY, QUANTITATIVE: Hep B S AB Quant (Post): <3.5 m[IU]/mL — ABNORMAL LOW

## 2023-03-18 MED ORDER — EMTRICITABINE-TENOFOVIR DF 200-300 MG PO TABS: 1.0000 | ORAL_TABLET | Freq: Every day | ORAL | 3 refills | Status: DC

## 2023-03-18 NOTE — Telephone Encounter (Signed)
Patient called wanting to know results from yesterday.   Relayed that HIV, Hep C, syphilis, gonorrhea, and chlamydia were negative. Discussed that he has immunity to Hep A, but would likely benefit from Hep B vaccination. Patient verbalized understanding and has no further questions.   Sandie Ano, RN

## 2023-03-29 ENCOUNTER — Other Ambulatory Visit: Payer: Self-pay

## 2023-03-29 ENCOUNTER — Emergency Department
Admission: EM | Admit: 2023-03-29 | Discharge: 2023-03-29 | Disposition: A | Discharge status: Home / Self Care | Payer: Medicaid Other | Attending: Emergency Medicine | Admitting: Emergency Medicine

## 2023-03-29 DIAGNOSIS — Z98818 Other dental procedure status: Secondary | ICD-10-CM | POA: Insufficient documentation

## 2023-03-29 MED ORDER — OXYCODONE-ACETAMINOPHEN 5-325 MG PO TABS
1.0000 | ORAL_TABLET | Freq: Four times a day (QID) | ORAL | 0 refills | Status: DC | PRN
Start: 2023-03-29 — End: 2023-06-21

## 2023-03-29 NOTE — ED Provider Notes (Signed)
[facility] Provider Note    Event Date/Time   First MD Initiated Contact with Patient 03/29/23 1502     (approximate)   History   Dental Pain   HPI  John Spence is a 32 y.o. male reports he had his 2 back lower wisdom teeth extracted about 4 hours ago.  He was advised to he did not need a prescription pain medicine.  He reports that he is doing okay but anticipates his teeth are quite a bit more sore than he thought.  He would like something at least temporary and he is for pain medicine at this time.  He has been using gauze, and received instructions from the dentist on how to care for his teeth after  Very slight swelling.  Very small amount of bleeding of the gauze.  No difficulty opening or closing the mouth.  No chest pain or shortness of breath.     Physical Exam   Triage Vital Signs: ED Triage Vitals  Encounter Vitals Group     BP 03/29/23 1455 (!) 154/75     Systolic BP Percentile --      Diastolic BP Percentile --      Pulse Rate 03/29/23 1455 65     Resp 03/29/23 1455 17     Temp 03/29/23 1455 98.6 F (37 C)     Temp Source 03/29/23 1455 Oral     SpO2 03/29/23 1455 98 %     Weight 03/29/23 1456 175 lb (79.4 kg)     Height 03/29/23 1456 6' (1.829 m)     Head Circumference --      Peak Flow --      Pain Score 03/29/23 1457 8     Pain Loc --      Pain Education --      Exclude from Growth Chart --     Most recent vital signs: Vitals:   03/29/23 1455  BP: (!) 154/75  Pulse: 65  Resp: 17  Temp: 98.6 F (37 C)  SpO2: 98%     General: Awake, no distress.  CV:  Good peripheral perfusion.  Resp:  Normal effort.  Abd:  No distention.  Other:    Oropharynx is widely patent.  Opens his mouth without difficulty no trismus.  He has extraction points cavities from where the wisdom teeth have been removed, there is minimal oozing and a gauze which is replaced.  There is no large swelling, no hematoma.  There is no out-of-control  bleeding.  Appears to be as expected for recent wisdom tooth extraction.   ED Results / Procedures / Treatments   Labs (all labs ordered are listed, but only abnormal results are displayed) Labs Reviewed - No data to display   EKG     RADIOLOGY     PROCEDURES:  Critical Care performed: No  Procedures   MEDICATIONS ORDERED IN ED: Medications - No data to display   IMPRESSION / MDM / ASSESSMENT AND PLAN / ED COURSE  I reviewed the triage vital signs and the nursing notes.                              Differential diagnosis includes, but is not limited to, status post wisdom tooth extraction, dry socket, postoperative pain, etc.  Patient with very reassuring examination consistent with recent wisdom tooth extraction without any obvious complication.  He does report it is quite painful  and he thinks he will not be able to use Tylenol for pain relief, he has used hydrocodone in the past but this caused nausea.  Will trial a different agent, in this case Percocet.  Discussed with Katie patient careful use. I will prescribe the patient a narcotic pain medicine due to their condition which I anticipate will cause at least moderate pain short term. I discussed with the patient safe use of narcotic pain medicines, and that they are not to drive, work in dangerous areas, or ever take more than prescribed (no more than 1 pill every 6 hours). We discussed that this is the type of medication that can be  overdosed on and the risks of this type of medicine. Patient is very agreeable to only use as prescribed and to never use more than prescribed.  He is agreeable to following up closely with his dentist.  Patient's presentation is most consistent with acute, uncomplicated illness.     Return precautions and treatment recommendations and follow-up discussed with the patient who is agreeable with the plan.      FINAL CLINICAL IMPRESSION(S) / ED DIAGNOSES   Final diagnoses:  S/P  wisdom tooth extraction     Rx / DC Orders   ED Discharge Orders          Ordered    oxyCODONE-acetaminophen (PERCOCET/ROXICET) 5-325 MG tablet  Every 6 hours PRN        03/29/23 1511             Note:  This document was prepared using Dragon voice recognition software and may include unintentional dictation errors.   Sharyn Creamer, MD 03/29/23 1534

## 2023-03-29 NOTE — Discharge Instructions (Addendum)
No driving or working with dangerous equipment or in dangerous places within 8 hours while taking oxycodone.

## 2023-03-29 NOTE — ED Triage Notes (Signed)
Pt to ED for bilateral lower dental pain, states had 2 teeth pulled 4 hours ago. Took tylenol with no relief.  Pt talking on the phone in triage.

## 2023-03-29 NOTE — ED Notes (Signed)
See triage note  Presents with dental pain   States he had 2 teeth pulled  no  relief with  tylenol

## 2023-05-02 ENCOUNTER — Ambulatory Visit: Payer: Self-pay

## 2023-06-21 ENCOUNTER — Ambulatory Visit: Payer: Medicaid Other | Admitting: Infectious Diseases

## 2023-06-21 ENCOUNTER — Ambulatory Visit: Payer: Medicaid Other | Attending: Infectious Diseases | Admitting: Infectious Diseases

## 2023-06-21 ENCOUNTER — Other Ambulatory Visit: Payer: Self-pay

## 2023-06-21 ENCOUNTER — Encounter: Payer: Self-pay | Admitting: Infectious Diseases

## 2023-06-21 ENCOUNTER — Other Ambulatory Visit
Admission: RE | Admit: 2023-06-21 | Discharge: 2023-06-21 | Disposition: A | Discharge status: Home / Self Care | Payer: Medicaid Other | Source: Ambulatory Visit | Attending: Infectious Diseases | Admitting: Infectious Diseases

## 2023-06-21 VITALS — BP 159/88 | HR 70 | Temp 96.6°F | Ht 72.0 in | Wt 175.0 lb

## 2023-06-21 DIAGNOSIS — R7309 Other abnormal glucose: Secondary | ICD-10-CM | POA: Insufficient documentation

## 2023-06-21 DIAGNOSIS — Z7252 High risk homosexual behavior: Secondary | ICD-10-CM | POA: Insufficient documentation

## 2023-06-21 DIAGNOSIS — Z114 Encounter for screening for human immunodeficiency virus [HIV]: Secondary | ICD-10-CM | POA: Insufficient documentation

## 2023-06-21 DIAGNOSIS — Z2981 Encounter for HIV pre-exposure prophylaxis: Secondary | ICD-10-CM | POA: Diagnosis not present

## 2023-06-21 DIAGNOSIS — F17210 Nicotine dependence, cigarettes, uncomplicated: Secondary | ICD-10-CM | POA: Diagnosis not present

## 2023-06-21 DIAGNOSIS — Z23 Encounter for immunization: Secondary | ICD-10-CM | POA: Diagnosis not present

## 2023-06-21 DIAGNOSIS — Z79899 Other long term (current) drug therapy: Secondary | ICD-10-CM | POA: Insufficient documentation

## 2023-06-21 DIAGNOSIS — Z9189 Other specified personal risk factors, not elsewhere classified: Secondary | ICD-10-CM | POA: Insufficient documentation

## 2023-06-21 DIAGNOSIS — Z113 Encounter for screening for infections with a predominantly sexual mode of transmission: Secondary | ICD-10-CM | POA: Insufficient documentation

## 2023-06-21 LAB — CBC WITH DIFFERENTIAL/PLATELET
Abs Immature Granulocytes: 0 10*3/uL (ref 0.00–0.07)
Basophils Absolute: 0 10*3/uL (ref 0.0–0.1)
Basophils Relative: 1 %
Eosinophils Absolute: 0.1 10*3/uL (ref 0.0–0.5)
Eosinophils Relative: 1 %
HCT: 44.1 % (ref 39.0–52.0)
Hemoglobin: 15 g/dL (ref 13.0–17.0)
Immature Granulocytes: 0 %
Lymphocytes Relative: 31 %
Lymphs Abs: 1.8 10*3/uL (ref 0.7–4.0)
MCH: 32.7 pg (ref 26.0–34.0)
MCHC: 34 g/dL (ref 30.0–36.0)
MCV: 96.1 fL (ref 80.0–100.0)
Monocytes Absolute: 0.6 10*3/uL (ref 0.1–1.0)
Monocytes Relative: 10 %
Neutro Abs: 3.3 10*3/uL (ref 1.7–7.7)
Neutrophils Relative %: 57 %
Platelets: 275 10*3/uL (ref 150–400)
RBC: 4.59 MIL/uL (ref 4.22–5.81)
RDW: 14.1 % (ref 11.5–15.5)
WBC: 5.7 10*3/uL (ref 4.0–10.5)
nRBC: 0 % (ref 0.0–0.2)

## 2023-06-21 LAB — COMPREHENSIVE METABOLIC PANEL
ALT: 32 U/L (ref 0–44)
AST: 21 U/L (ref 15–41)
Albumin: 4.5 g/dL (ref 3.5–5.0)
Alkaline Phosphatase: 72 U/L (ref 38–126)
Anion gap: 10 (ref 5–15)
BUN: 18 mg/dL (ref 6–20)
CO2: 23 mmol/L (ref 22–32)
Calcium: 9 mg/dL (ref 8.9–10.3)
Chloride: 105 mmol/L (ref 98–111)
Creatinine, Ser: 1.04 mg/dL (ref 0.61–1.24)
GFR, Estimated: >60 mL/min (ref >60)
Glucose, Bld: 91 mg/dL (ref 70–99)
Potassium: 3.8 mmol/L (ref 3.5–5.1)
Sodium: 138 mmol/L (ref 135–145)
Total Bilirubin: 0.7 mg/dL (ref 0.0–1.2)
Total Protein: 7.6 g/dL (ref 6.5–8.1)

## 2023-06-21 LAB — URINALYSIS, ROUTINE W REFLEX MICROSCOPIC
Bilirubin Urine: NEGATIVE
Glucose, UA: NEGATIVE mg/dL
Hgb urine dipstick: NEGATIVE
Ketones, ur: NEGATIVE mg/dL
Leukocytes,Ua: NEGATIVE
Nitrite: NEGATIVE
Protein, ur: NEGATIVE mg/dL
Specific Gravity, Urine: 1.011 (ref 1.005–1.030)
pH: 6 (ref 5.0–8.0)

## 2023-06-21 LAB — CHLAMYDIA/NGC RT PCR (ARMC ONLY)
Chlamydia Tr: NOT DETECTED
Chlamydia Tr: NOT DETECTED
N gonorrhoeae: NOT DETECTED
N gonorrhoeae: NOT DETECTED

## 2023-06-21 LAB — HIV ANTIBODY (ROUTINE TESTING W REFLEX): HIV Screen 4th Generation wRfx: NONREACTIVE

## 2023-06-21 MED ORDER — EMTRICITABINE-TENOFOVIR DF 200-300 MG PO TABS: 1.0000 | ORAL_TABLET | Freq: Every day | ORAL | 3 refills | Status: DC

## 2023-06-21 NOTE — Patient Instructions (Addendum)
 Today will do labs- will continue Truvada Will start HPV vaccine  Gardisil  3 doses1-2-6

## 2023-06-21 NOTE — Progress Notes (Signed)
 ID PREP Visit NAME: John Spence  DOB: [birth year]  MRN: [id number]  Date/Time: 06/21/2023 11:03 AM Subjective:   PreP visit John Spence is a 33 y.o. with no PMH On truvada  Risk-MSM Partners in the past 6 months-1 Partner is HIV positive newly diagnosed and is on HAART and undetectable Unprotected oral sex   PMH none PSH none  Social History   Socioeconomic History   Marital status: Single    Spouse name: Not on file   Number of children: Not on file   Years of education: Not on file   Highest education level: Not on file  Occupational History   Not on file  Tobacco Use   Smoking status: Every Day    Current packs/day: 1.00    Types: Cigarettes   Smokeless tobacco: Never   Tobacco comments:    down to 2 cigarettes  Vaping Use   Vaping status: Never Used  Substance and Sexual Activity   Alcohol use: Yes    Comment: Occasional   Drug use: Yes    Types: Marijuana   Sexual activity: Yes    Birth control/protection: Condom  Other Topics Concern   Not on file  Social History Narrative   Not on file   Social Drivers of Health   Financial Resource Strain: Not on file  Food Insecurity: Not on file  Transportation Needs: Not on file  Physical Activity: Not on file  Stress: Not on file  Social Connections: Not on file  Intimate Partner Violence: Not on file    FH Mom -HTN Grandfather- lung cancer, DM No Known Allergies ? Current Outpatient Medications  Medication Sig Dispense Refill   emtricitabine -tenofovir  (TRUVADA) 200-300 MG tablet Take 1 tablet by mouth daily. 30 tablet 3   No current facility-administered medications for this visit.    REVIEW OF SYSTEMS:  Const: negative fever, negative chills, negative weight loss Eyes: negative diplopia or visual changes, negative eye pain ENT: negative coryza, negative sore throat Resp: negative cough, hemoptysis, dyspnea Cards: negative for chest pain, palpitations, lower extremity edema GU: negative for  frequency, dysuria and hematuria Skin: negative for rash and pruritus Heme: negative for easy bruising and gum/nose bleeding MS: negative for myalgias, arthralgias, back pain and muscle weakness Neurolo:negative for headaches, dizziness, vertigo, memory problems  Psych: feelings of anxiety  ? Objective:  VITALS:  BP (!) 159/88   Pulse 70   Temp (!) 96.6 F (35.9 C) (Temporal)   Ht 6' (1.829 m)   Wt 175 lb (79.4 kg)   BMI 23.73 kg/m   General: Alert, cooperative, no distress, appears stated age.  Head: Normocephalic, without obvious abnormality, atraumatic. Eyes: Conjunctivae clear, anicteric sclerae. Pupils are equal Nose: Nares normal. No drainage or sinus tenderness. Throat: Lips, mucosa, and tongue normal. No Thrush Neck: Supple, symmetrical, no adenopathy, thyroid: non tender no carotid bruit and no JVD. Back: No CVA tenderness. Lungs: Clear to auscultation bilaterally. No Wheezing or Rhonchi. No rales. Heart: Regular rate and rhythm, no murmur, rub or gallop. Abdomen: Soft, non-tender,not distended. Bowel sounds normal. No masses Extremities: Extremities normal, atraumatic, no cyanosis. No edema. No clubbing Skin: No rashes or lesions. Not Jaundiced Lymph: Cervical, supraclavicular normal. Neurologic: Grossly non-focal Pertinent Labs ? Pre-Prescription  HIV CMP HBV serology ( HBsag, S ab , core antibody) HAV HCV RPR/ GC/CHL UA All neg Impression/Recommendation  Pre exposure prophylaxis for HIV with truvada Is 100% adherent Partner is undetectable Discussed Undetectable =Untransamittable in monogamous relationship  Discussed how  to recognize newly acquired HIv infection Will do labs today- STD screening  Discussed vaccination- HPV, and hepatitis vaccine Started HPV vaccine today HEPB needed  Discussed the management with the patient in detail Follow 2 months for 2nd dose HPV Follow 4 months with me  P.S- all labs  rpr, HIV, GC/Chl were negative)  informed patient after the visit Follow up 4 months

## 2023-06-22 ENCOUNTER — Encounter: Payer: Self-pay | Admitting: Infectious Diseases

## 2023-06-22 ENCOUNTER — Telehealth: Payer: Self-pay

## 2023-06-22 LAB — HEMOGLOBIN A1C
Hgb A1c MFr Bld: 5.5 % (ref 4.8–5.6)
Mean Plasma Glucose: 111 mg/dL

## 2023-06-22 LAB — RPR: RPR Ser Ql: NONREACTIVE

## 2023-06-22 NOTE — Telephone Encounter (Signed)
 Patient called requesting lab results.     Mlissa Tamayo Lesli Albee, CMA

## 2023-06-22 NOTE — Telephone Encounter (Signed)
 Secure chat sent to Dr.Ravishankar - all is good with labs, including sugar. Does need to cut back on power aid drinks.   Patient aware of results and voiced his understanding.    Arijana Narayan Lesli Albee, CMA

## 2023-08-03 ENCOUNTER — Telehealth: Payer: Self-pay

## 2023-08-03 NOTE — Telephone Encounter (Signed)
Patient called, taking Truvada for PrEP. Asking if he can take aspirin as needed.   Reviewed HIV Drug Therapy Guide and discussed recommendation with patient that he avoid long-term use or high doses of NSAIDs. Patient verbalized understanding and has no further questions.   Sandie Ano, RN

## 2023-08-03 NOTE — Telephone Encounter (Signed)
Nope that is great. Thanks!

## 2023-09-06 ENCOUNTER — Ambulatory Visit: Payer: Medicaid Other | Admitting: Family Medicine

## 2023-09-26 ENCOUNTER — Ambulatory Visit: Payer: Medicaid Other | Admitting: Family Medicine

## 2023-10-18 ENCOUNTER — Ambulatory Visit: Payer: Medicaid Other | Admitting: Infectious Diseases

## 2023-10-25 ENCOUNTER — Ambulatory Visit: Admitting: Infectious Diseases

## 2023-10-27 ENCOUNTER — Emergency Department
Admission: EM | Admit: 2023-10-27 | Discharge: 2023-10-27 | Disposition: A | Discharge status: Home / Self Care | Attending: Emergency Medicine | Admitting: Emergency Medicine

## 2023-10-27 ENCOUNTER — Other Ambulatory Visit: Payer: Self-pay

## 2023-10-27 ENCOUNTER — Encounter: Payer: Self-pay | Admitting: Emergency Medicine

## 2023-10-27 ENCOUNTER — Telehealth: Payer: Self-pay

## 2023-10-27 DIAGNOSIS — J069 Acute upper respiratory infection, unspecified: Secondary | ICD-10-CM | POA: Diagnosis not present

## 2023-10-27 DIAGNOSIS — F17210 Nicotine dependence, cigarettes, uncomplicated: Secondary | ICD-10-CM | POA: Insufficient documentation

## 2023-10-27 DIAGNOSIS — R519 Headache, unspecified: Secondary | ICD-10-CM | POA: Diagnosis present

## 2023-10-27 LAB — RESP PANEL BY RT-PCR (RSV, FLU A&B, COVID)  RVPGX2
Influenza A by PCR: NEGATIVE
Influenza B by PCR: NEGATIVE
Resp Syncytial Virus by PCR: NEGATIVE
SARS Coronavirus 2 by RT PCR: NEGATIVE

## 2023-10-27 MED ORDER — LIDOCAINE HCL (PF) 1 % IJ SOLN: 5.0000 mL | Freq: Once | INTRAMUSCULAR | Status: DC

## 2023-10-27 MED ORDER — SODIUM CHLORIDE 0.9 % IV SOLN: 100.0000 mg | Freq: Once | INTRAVENOUS | Status: DC

## 2023-10-27 MED ORDER — MORPHINE SULFATE (PF) 4 MG/ML IV SOLN: 4.0000 mg | Freq: Once | INTRAVENOUS | Status: DC

## 2023-10-27 NOTE — ED Provider Notes (Signed)
 [facility] Provider Note    Event Date/Time   First MD Initiated Contact with Patient 10/27/23 1022     (approximate)   History   Nasal Congestion   HPI  John Spence is a 33 y.o. male   presents to the ED with complaint of headache and nasal congestion that started yesterday.  Patient states that symptoms began with sudden onset.  He started a new job on Monday but is unaware of any known sick contacts.  Patient smokes cigarettes and marijuana and has a history of anxiety.  Patient also reports that he takes Truvada and is HIV negative.      Physical Exam   Triage Vital Signs: ED Triage Vitals  Encounter Vitals Group     BP 10/27/23 1018 (!) 151/115     Systolic BP Percentile --      Diastolic BP Percentile --      Pulse Rate 10/27/23 1018 96     Resp 10/27/23 1018 17     Temp 10/27/23 1016 99.4 F (37.4 C)     Temp Source 10/27/23 1016 Oral     SpO2 10/27/23 1018 99 %     Weight 10/27/23 1017 175 lb (79.4 kg)     Height 10/27/23 1017 6' (1.829 m)     Head Circumference --      Peak Flow --      Pain Score 10/27/23 1017 0     Pain Loc --      Pain Education --      Exclude from Growth Chart --     Most recent vital signs: Vitals:   10/27/23 1030 10/27/23 1100  BP: (!) 155/100 136/84  Pulse: (!) 115 98  Resp:    Temp:    SpO2: 100% 99%     General: Awake, no distress.  CV:  Good peripheral perfusion.  Resp:  Normal effort.  Lungs clear bilaterally. Abd:  No distention.  Other:  No nasal discharge or congestion present.   ED Results / Procedures / Treatments   Labs (all labs ordered are listed, but only abnormal results are displayed) Labs Reviewed  RESP PANEL BY RT-PCR (RSV, FLU A&B, COVID)  [id number]      PROCEDURES:  Critical Care performed:   Procedures   MEDICATIONS ORDERED IN ED: Medications - No data to display   IMPRESSION / MDM / ASSESSMENT AND PLAN / ED COURSE  I reviewed the triage vital signs and  the nursing notes.   Differential diagnosis includes, but is not limited to, COVID, influenza, RSV, seasonal allergies, viral URI.  33 year old male presents to the ED with URI symptoms that began yesterday.  Initial blood pressure was elevated and patient states that he gets extremely nervous around hospitals and smoked marijuana PTA.  Blood pressure prior to discharge was 136/84.  Patient was made aware that his respiratory panel was negative.  We discussed over-the-counter medications and hydration.  A note was written for him to remain out of work today and tomorrow.  Patient will follow-up with his PCP if any continued problems.      Patient's presentation is most consistent with acute complicated illness / injury requiring diagnostic workup.  FINAL CLINICAL IMPRESSION(S) / ED DIAGNOSES   Final diagnoses:  Viral URI     Rx / DC Orders   ED Discharge Orders     None        Note:  This document was prepared using Dragon voice  recognition software and may include unintentional dictation errors.   Stafford Eagles, PA-C 10/27/23 1430    Arline Bennett, MD 10/27/23 1444

## 2023-10-27 NOTE — Discharge Instructions (Addendum)
 Follow-up with your primary care provider if any continued problems or concerns.  Also follow-up with your primary care provider for recheck of your blood pressure as it was elevated in the emergency department at 151/115 when you first arrived.  Continue with your regular medication and also increase fluids.  Tylenol  or ibuprofen  if needed for body aches and over-the-counter cough and congestion medication that does not contain pseudoephedrine in it.  Mucinex would be one to take if needed for congestion.

## 2023-10-27 NOTE — ED Triage Notes (Signed)
 Patient to ED via POV for congestion and headache. Ongoing since yesterday. NAD noted. Pt reports smoking marijuana PTA.

## 2023-10-27 NOTE — Telephone Encounter (Signed)
 John Spence called back, he states he's thinking about wanting to stop Truvada. Reviewed that this decision is ultimately up to him and discussed that since his partner is living with HIV, this is an added layer of protection. Reviewed U=U and that if his partner is consistent with medication and remains undetectable that he is not at risk of acquiring HIV sexually.   He states he's fine with staying on Truvada, but that it is "annoying" to have to come into the clinic so often. Encouraged him to discuss his concerns with provider at his upcoming appointment.   Damya Comley, BSN, RN

## 2023-10-27 NOTE — Telephone Encounter (Signed)
 Patient called to reschedule missed appointment. He has questions about why he has to be seen so often. Discussed importance of routine HIV testing while on PrEP to ensure he has not acquired HIV and therefore under-treating with an incomplete regimen.   He states he went to the ED today for congestion and overall not feeling well. He asks if this could be a sign of HIV infection. Discussed signs/symptoms of acute HIV infection, but he confirms he has been taking his Truvada consistently and has not had any recent sexual contact. Reviewed that his risk is low because of this and reassured him that provider would check HIV labs at his next appointment.   Patient verbalized understanding and has no further questions.   John Spence, BSN, RN

## 2023-11-08 ENCOUNTER — Other Ambulatory Visit: Payer: Self-pay

## 2023-11-08 ENCOUNTER — Emergency Department
Admission: EM | Admit: 2023-11-08 | Discharge: 2023-11-08 | Disposition: A | Discharge status: Home / Self Care | Attending: Emergency Medicine | Admitting: Emergency Medicine

## 2023-11-08 DIAGNOSIS — M542 Cervicalgia: Secondary | ICD-10-CM | POA: Insufficient documentation

## 2023-11-08 DIAGNOSIS — Y92410 Unspecified street and highway as the place of occurrence of the external cause: Secondary | ICD-10-CM | POA: Diagnosis not present
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
## 2023-11-08 NOTE — Discharge Instructions (Signed)
 You can take 650 mg of Tylenol and 600 mg of ibuprofen every 6 hours as needed for pain. You can use ice, heat, muscle creams and other topical pain relievers as well.  You will likely be more sore tomorrow than you are today, this is normal. After this your pain should slowly be improving. If not, you need to be seen by another health care provider. This could be your PCP, urgent care or in the emergency department. Return to the emergency department specifically, if you have development of chest pain, shortness of breath, abdominal pain, new abdominal bruising or any other symptom personally concerning to you.
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
## 2023-11-10 ENCOUNTER — Ambulatory Visit: Admitting: Infectious Diseases

## 2023-11-15 ENCOUNTER — Other Ambulatory Visit
Admission: RE | Admit: 2023-11-15 | Discharge: 2023-11-15 | Disposition: A | Discharge status: Home / Self Care | Attending: Infectious Diseases | Admitting: Infectious Diseases

## 2023-11-15 ENCOUNTER — Ambulatory Visit: Admitting: Infectious Diseases

## 2023-11-15 ENCOUNTER — Other Ambulatory Visit: Payer: Self-pay

## 2023-11-15 DIAGNOSIS — Z113 Encounter for screening for infections with a predominantly sexual mode of transmission: Secondary | ICD-10-CM | POA: Diagnosis present

## 2023-11-15 DIAGNOSIS — B20 Human immunodeficiency virus [HIV] disease: Secondary | ICD-10-CM | POA: Diagnosis present

## 2023-11-15 LAB — CBC WITH DIFFERENTIAL/PLATELET
Abs Immature Granulocytes: 0.01 10*3/uL (ref 0.00–0.07)
Basophils Absolute: 0.1 10*3/uL (ref 0.0–0.1)
Basophils Relative: 1 %
Eosinophils Absolute: 0.1 10*3/uL (ref 0.0–0.5)
Eosinophils Relative: 1 %
HCT: 43.4 % (ref 39.0–52.0)
Hemoglobin: 15.1 g/dL (ref 13.0–17.0)
Immature Granulocytes: 0 %
Lymphocytes Relative: 32 %
Lymphs Abs: 2.1 10*3/uL (ref 0.7–4.0)
MCH: 34.1 pg — ABNORMAL HIGH (ref 26.0–34.0)
MCHC: 34.8 g/dL (ref 30.0–36.0)
MCV: 98 fL (ref 80.0–100.0)
Monocytes Absolute: 0.5 10*3/uL (ref 0.1–1.0)
Monocytes Relative: 8 %
Neutro Abs: 3.8 10*3/uL (ref 1.7–7.7)
Neutrophils Relative %: 58 %
Platelets: 305 10*3/uL (ref 150–400)
RBC: 4.43 MIL/uL (ref 4.22–5.81)
RDW: 13.3 % (ref 11.5–15.5)
WBC: 6.6 10*3/uL (ref 4.0–10.5)
nRBC: 0 % (ref 0.0–0.2)

## 2023-11-15 LAB — COMPREHENSIVE METABOLIC PANEL WITH GFR
ALT: 24 U/L (ref 0–44)
AST: 18 U/L (ref 15–41)
Albumin: 4.1 g/dL (ref 3.5–5.0)
Alkaline Phosphatase: 85 U/L (ref 38–126)
Anion gap: 7 (ref 5–15)
BUN: 13 mg/dL (ref 6–20)
CO2: 26 mmol/L (ref 22–32)
Calcium: 8.8 mg/dL — ABNORMAL LOW (ref 8.9–10.3)
Chloride: 108 mmol/L (ref 98–111)
Creatinine, Ser: 0.96 mg/dL (ref 0.61–1.24)
GFR, Estimated: >60 mL/min (ref >60)
Glucose, Bld: 100 mg/dL — ABNORMAL HIGH (ref 70–99)
Potassium: 3.8 mmol/L (ref 3.5–5.1)
Sodium: 141 mmol/L (ref 135–145)
Total Bilirubin: 0.7 mg/dL (ref 0.0–1.2)
Total Protein: 7.1 g/dL (ref 6.5–8.1)

## 2023-11-15 MED ORDER — EMTRICITABINE-TENOFOVIR DF 200-300 MG PO TABS: 1.0000 | ORAL_TABLET | Freq: Every day | ORAL | 0 refills | Status: DC

## 2023-11-16 ENCOUNTER — Ambulatory Visit: Payer: Self-pay

## 2023-11-16 ENCOUNTER — Other Ambulatory Visit

## 2023-11-16 ENCOUNTER — Other Ambulatory Visit: Payer: Self-pay

## 2023-11-16 LAB — HIV-1 RNA QUANT-NO REFLEX-BLD
HIV 1 RNA Quant: <20 {copies}/mL
LOG10 HIV-1 RNA: UNDETERMINED {Log_copies}/mL

## 2023-11-16 LAB — RPR: RPR Ser Ql: NONREACTIVE

## 2023-11-25 ENCOUNTER — Emergency Department
Admission: EM | Admit: 2023-11-25 | Discharge: 2023-11-25 | Disposition: A | Discharge status: Home / Self Care | Attending: Emergency Medicine | Admitting: Emergency Medicine

## 2023-11-25 ENCOUNTER — Other Ambulatory Visit: Payer: Self-pay

## 2023-11-25 DIAGNOSIS — R369 Urethral discharge, unspecified: Secondary | ICD-10-CM | POA: Insufficient documentation

## 2023-11-25 LAB — CHLAMYDIA/NGC RT PCR (ARMC ONLY)
Chlamydia Tr: NOT DETECTED
N gonorrhoeae: NOT DETECTED

## 2023-11-25 NOTE — ED Triage Notes (Signed)
 Pt comes in via pov wanting to have a STD check, pt is currently on PREP, and is tested every 3 months. Was tested last week for HIV, but not for STD's. Pt has concerns of his partners activity. Pt reports white discharge this morning, and wants to be safe. No itching or pain reported with urination. Pt with no signs of acute distress at this time.

## 2023-11-25 NOTE — ED Provider Notes (Signed)
 [facility] Provider Note    Event Date/Time   First MD Initiated Contact with Patient 11/25/23 [phone]     (approximate)   History   SEXUALLY TRANSMITTED DISEASE   HPI  John Spence is a 33 y.o. male with no significant past medical history presents emergency department with white penile discharge.  Patient states he is on the HIV preventative.  His partners positive for HIV.  No other concerns at this time.  Patient does get tested by his regular doctor for HIV but he did not test him for syphilis, gonorrhea or chlamydia      Physical Exam   Triage Vital Signs: ED Triage Vitals  Encounter Vitals Group     BP 11/25/23 0811 (!) 142/98     Girls Systolic BP Percentile --      Girls Diastolic BP Percentile --      Boys Systolic BP Percentile --      Boys Diastolic BP Percentile --      Pulse Rate 11/25/23 0811 66     Resp 11/25/23 0811 17     Temp 11/25/23 0811 98.6 F (37 C)     Temp src --      SpO2 11/25/23 0811 100 %     Weight 11/25/23 0812 175 lb 0.7 oz (79.4 kg)     Height 11/25/23 0812 6' (1.829 m)     Head Circumference --      Peak Flow --      Pain Score 11/25/23 0812 0     Pain Loc --      Pain Education --      Exclude from Growth Chart --     Most recent vital signs: Vitals:   11/25/23 0811  BP: (!) 142/98  Pulse: 66  Resp: 17  Temp: 98.6 F (37 C)  SpO2: 100%     General: Awake, no distress.   CV:  Good peripheral perfusion. Resp:  Normal effort.  Abd:  No distention.   Other:      ED Results / Procedures / Treatments   Labs (all labs ordered are listed, but only abnormal results are displayed) Labs Reviewed  CHLAMYDIA/NGC RT PCR ([facility] ONLY)            RPR     EKG     RADIOLOGY     PROCEDURES:   Procedures  Critical Care:  no Chief Complaint  Patient presents with   SEXUALLY TRANSMITTED DISEASE      MEDICATIONS ORDERED IN ED: Medications - No data to display   IMPRESSION / MDM /  ASSESSMENT AND PLAN / ED COURSE  I reviewed the triage vital signs and the nursing notes.                              Differential diagnosis includes, but is not limited to, STD, penile discharge, feared complaint without diagnosis  Patient's presentation is most consistent with acute complicated illness / injury requiring diagnostic workup.   I did explain to the patient that these type of complaints are best served by going to the [facility] department.  We did perform STD testing.  He can see his results on [facility] MyChart.  Follow-up with his doctor for treatment if needed.  Return emergency department if positive for gonorrhea.  Patient is in agreement treatment plan.  Discharged stable condition.      FINAL  CLINICAL IMPRESSION(S) / ED DIAGNOSES   Final diagnoses:  Penile discharge     Rx / DC Orders   ED Discharge Orders     None        Note:  This document was prepared using Dragon voice recognition software and may include unintentional dictation errors.    Delsie Figures, PA-C 11/25/23 1610    Bryson Carbine, MD 11/25/23 [phone]

## 2023-11-26 LAB — RPR: RPR Ser Ql: NONREACTIVE

## 2023-12-15 ENCOUNTER — Other Ambulatory Visit: Payer: Self-pay

## 2023-12-15 ENCOUNTER — Other Ambulatory Visit: Payer: Self-pay | Admitting: Infectious Diseases

## 2023-12-15 MED ORDER — EMTRICITABINE-TENOFOVIR DF 200-300 MG PO TABS: 1.0000 | ORAL_TABLET | Freq: Every day | ORAL | 2 refills | Status: DC

## 2023-12-15 NOTE — Progress Notes (Signed)
 Refill sent for PREP

## 2023-12-20 ENCOUNTER — Ambulatory Visit: Admitting: Infectious Diseases

## 2023-12-22 ENCOUNTER — Ambulatory Visit: Admitting: Infectious Diseases

## 2023-12-27 ENCOUNTER — Ambulatory Visit: Admitting: Infectious Diseases

## 2024-03-14 ENCOUNTER — Other Ambulatory Visit: Payer: Self-pay | Admitting: Infectious Diseases

## 2024-03-14 ENCOUNTER — Other Ambulatory Visit
Admission: RE | Admit: 2024-03-14 | Discharge: 2024-03-14 | Disposition: A | Discharge status: Home / Self Care | Attending: Infectious Diseases | Admitting: Infectious Diseases

## 2024-03-14 ENCOUNTER — Other Ambulatory Visit: Payer: Self-pay

## 2024-03-14 DIAGNOSIS — Z9189 Other specified personal risk factors, not elsewhere classified: Secondary | ICD-10-CM

## 2024-03-14 DIAGNOSIS — Z113 Encounter for screening for infections with a predominantly sexual mode of transmission: Secondary | ICD-10-CM | POA: Insufficient documentation

## 2024-03-14 LAB — HIV ANTIBODY (ROUTINE TESTING W REFLEX): HIV Screen 4th Generation wRfx: NONREACTIVE

## 2024-03-14 NOTE — Telephone Encounter (Signed)
 Patient called to request Truvada refill. Scheduled overdue follow up appointment.   Ferrell Claiborne, BSN, RN

## 2024-03-14 NOTE — Addendum Note (Signed)
 Addended by: ALEX SLOUGH C on: 03/14/2024 10:09 AM   Modules accepted: Orders

## 2024-03-14 NOTE — Telephone Encounter (Signed)
 Spoke with patient, notified him that he would need labs done prior to refill. He will go by [facility] today for HIV antibody and complete virtual visit with Dr. Fayette next week.   Advised he would need to keep that appointment for additional refills as he has cancelled/no-showed his last 7 scheduled appointments.   Giulietta Prokop, BSN, RN

## 2024-03-15 ENCOUNTER — Ambulatory Visit: Payer: Self-pay

## 2024-03-20 ENCOUNTER — Telehealth: Admitting: Infectious Diseases

## 2024-04-14 ENCOUNTER — Telehealth: Admitting: Emergency Medicine

## 2024-04-14 ENCOUNTER — Other Ambulatory Visit: Payer: Self-pay | Admitting: Infectious Diseases

## 2024-04-14 DIAGNOSIS — Z9189 Other specified personal risk factors, not elsewhere classified: Secondary | ICD-10-CM | POA: Diagnosis not present

## 2024-04-14 MED ORDER — EMTRICITABINE-TENOFOVIR DF 200-300 MG PO TABS: 1.0000 | ORAL_TABLET | Freq: Every day | ORAL | 0 refills | Status: DC

## 2024-04-14 NOTE — Progress Notes (Signed)
 Virtual Visit Consent   John Spence, you are scheduled for a virtual visit with a [facility] provider today. Just as with appointments in the office, your consent must be obtained to participate. Your consent will be active for this visit and any virtual visit you may have with one of our providers in the next 365 days. If you have a MyChart account, a copy of this consent can be sent to you electronically.  As this is a virtual visit, video technology does not allow for your provider to perform a traditional examination. This may limit your provider's ability to fully assess your condition. If your provider identifies any concerns that need to be evaluated in person or the need to arrange testing (such as labs, EKG, etc.), we will make arrangements to do so. Although advances in technology are sophisticated, we cannot ensure that it will always work on either your end or our end. If the connection with a video visit is poor, the visit may have to be switched to a telephone visit. With either a video or telephone visit, we are not always able to ensure that we have a secure connection.  By engaging in this virtual visit, you consent to the provision of healthcare and authorize for your insurance to be billed (if applicable) for the services provided during this visit. Depending on your insurance coverage, you may receive a charge related to this service.  I need to obtain your verbal consent now. Are you willing to proceed with your visit today? John Spence has provided verbal consent on 04/14/2024 for a virtual visit (video or telephone). [name], NP  Date: 04/14/2024 6:32 PM   Virtual Visit via Video Note   I, [name], connected with  John Spence  ([id number], 02/04/1991) on 04/14/24 at  6:30 PM EDT by a video-enabled telemedicine application and verified that I am speaking with the correct person using two identifiers.  Location: Patient: Virtual Visit Location Patient:  Home Provider: Virtual Visit Location Provider: Home Office   I discussed the limitations of evaluation and management by telemedicine and the availability of in person appointments. The patient expressed understanding and agreed to proceed.    History of Present Illness: John Spence is a 33 y.o. who identifies as a male who was assigned male at birth, and is being seen today for refill of PrEP. Review of notes in epic show he has not seen ID as he was asked to do. No showed 10/7. Last had refill 10/1. Tested negative for HIV 10/1. Notes indicate no further refills until he sees ID.   HPI: HPI  Problems:  Patient Active Problem List   Diagnosis Date Noted   Anxiety 12/07/2019   Encounter to establish care 09/27/2019   Tooth pain 09/27/2019   Blurry vision, bilateral 09/27/2019   Smoking 09/27/2019    Allergies: No Known Allergies Medications:  Current Outpatient Medications:    emtricitabine -tenofovir  (TRUVADA) 200-300 MG tablet, Take 1 tablet by mouth daily., Disp: 3 tablet, Rfl: 0   emtricitabine -tenofovir  (TRUVADA) 200-300 MG tablet, Take 1 tablet by mouth once daily, Disp: 30 tablet, Rfl: 0  Observations/Objective: Patient is well-developed, well-nourished in no acute distress.  Resting comfortably  at home.  Head is normocephalic, atraumatic.  No labored breathing.  Speech is clear and coherent with logical content.  Patient is alert and oriented at baseline.    Assessment and Plan: 1. At risk for sexually transmitted disease due to partner  with HIV (Primary) - emtricitabine -tenofovir  (TRUVADA) 200-300 MG tablet; Take 1 tablet by mouth daily.  Dispense: 3 tablet; Refill: 0  ID notes make it clear no further refills until he sees them. However, it is Saturday night. I gave him refill with 3 tablets only to get through to Monday. He needs to contact ID on Monday to arrange appt and refills.   Follow Up Instructions: I discussed the assessment and treatment plan with the  patient. The patient was provided an opportunity to ask questions and all were answered. The patient agreed with the plan and demonstrated an understanding of the instructions.  A copy of instructions were sent to the patient via MyChart unless otherwise noted below.    The patient was advised to call back or seek an in-person evaluation if the symptoms worsen or if the condition fails to improve as anticipated.    [name], NP

## 2024-04-14 NOTE — Patient Instructions (Signed)
  [name], thank you for joining [name], NP for today's virtual visit.  While this provider is not your primary care provider (PCP), if your PCP is located in our provider database this encounter information will be shared with them immediately following your visit.   A [facility] MyChart account gives you access to today's visit and all your visits, tests, and labs performed at [facility]  click here if you don't have a [facility] MyChart account or go to [email or web address]  Consent: (Patient) John Spence provided verbal consent for this virtual visit at the beginning of the encounter.  Current Medications:  Current Outpatient Medications:    emtricitabine -tenofovir  (TRUVADA) 200-300 MG tablet, Take 1 tablet by mouth daily., Disp: 3 tablet, Rfl: 0   emtricitabine -tenofovir  (TRUVADA) 200-300 MG tablet, Take 1 tablet by mouth once daily, Disp: 30 tablet, Rfl: 0   Medications ordered in this encounter:  Meds ordered this encounter  Medications   emtricitabine -tenofovir  (TRUVADA) 200-300 MG tablet    Sig: Take 1 tablet by mouth daily.    Dispense:  3 tablet    Refill:  0     *If you need refills on other medications prior to your next appointment, please contact your pharmacy*  Follow-Up: Call back or seek an in-person evaluation if the symptoms worsen or if the condition fails to improve as anticipated.  [facility] [phone]  Other Instructions  Contact the infectious disease clinic Monday morning first thing to arrange an appointment and further refills. I only prescribed 3 pills to get you through the weekend.    If you have been instructed to have an in-person evaluation today at a local Urgent Care facility, please use the link below. It will take you to a list of all of our available [facility], including address, phone number and hours of operation. Please do not delay care.  [facility]  If you or a family member do not have a primary care provider, use the link below to schedule a visit and establish care. When you choose a [facility] primary care physician or advanced practice provider, you gain a long-term partner in health. Find a Primary Care Provider  Learn more about [facility] in-office and virtual care options: [facility] - Get Care Now

## 2024-04-15 ENCOUNTER — Encounter: Payer: Self-pay | Admitting: Emergency Medicine

## 2024-04-17 ENCOUNTER — Encounter: Payer: Self-pay | Admitting: Infectious Diseases

## 2024-04-17 ENCOUNTER — Telehealth: Payer: Self-pay | Admitting: Infectious Diseases

## 2024-04-17 ENCOUNTER — Other Ambulatory Visit
Admission: RE | Admit: 2024-04-17 | Discharge: 2024-04-17 | Disposition: A | Discharge status: Home / Self Care | Source: Ambulatory Visit | Attending: Infectious Diseases | Admitting: Infectious Diseases

## 2024-04-17 ENCOUNTER — Ambulatory Visit: Attending: Infectious Diseases | Admitting: Infectious Diseases

## 2024-04-17 ENCOUNTER — Other Ambulatory Visit (HOSPITAL_COMMUNITY): Payer: Self-pay

## 2024-04-17 VITALS — BP 141/101 | HR 99 | Temp 96.6°F | Ht 71.0 in | Wt 173.0 lb

## 2024-04-17 DIAGNOSIS — Z113 Encounter for screening for infections with a predominantly sexual mode of transmission: Secondary | ICD-10-CM | POA: Insufficient documentation

## 2024-04-17 DIAGNOSIS — Z23 Encounter for immunization: Secondary | ICD-10-CM

## 2024-04-17 DIAGNOSIS — Z9189 Other specified personal risk factors, not elsewhere classified: Secondary | ICD-10-CM | POA: Diagnosis present

## 2024-04-17 DIAGNOSIS — Z2981 Encounter for HIV pre-exposure prophylaxis: Secondary | ICD-10-CM | POA: Diagnosis not present

## 2024-04-17 LAB — CBC WITH DIFFERENTIAL/PLATELET
Abs Immature Granulocytes: 0.01 K/uL (ref 0.00–0.07)
Basophils Absolute: 0.1 K/uL (ref 0.0–0.1)
Basophils Relative: 1 %
Eosinophils Absolute: 0.1 K/uL (ref 0.0–0.5)
Eosinophils Relative: 1 %
HCT: 48.4 % (ref 39.0–52.0)
Hemoglobin: 16.9 g/dL (ref 13.0–17.0)
Immature Granulocytes: 0 %
Lymphocytes Relative: 32 %
Lymphs Abs: 1.8 K/uL (ref 0.7–4.0)
MCH: 33.5 pg (ref 26.0–34.0)
MCHC: 34.9 g/dL (ref 30.0–36.0)
MCV: 96 fL (ref 80.0–100.0)
Monocytes Absolute: 0.6 K/uL (ref 0.1–1.0)
Monocytes Relative: 11 %
Neutro Abs: 3.1 K/uL (ref 1.7–7.7)
Neutrophils Relative %: 55 %
Platelets: 273 K/uL (ref 150–400)
RBC: 5.04 MIL/uL (ref 4.22–5.81)
RDW: 13.3 % (ref 11.5–15.5)
WBC: 5.6 K/uL (ref 4.0–10.5)
nRBC: 0 % (ref 0.0–0.2)

## 2024-04-17 LAB — COMPREHENSIVE METABOLIC PANEL WITH GFR
ALT: 25 U/L (ref 0–44)
AST: 21 U/L (ref 15–41)
Albumin: 4.7 g/dL (ref 3.5–5.0)
Alkaline Phosphatase: 85 U/L (ref 38–126)
Anion gap: 13 (ref 5–15)
BUN: 15 mg/dL (ref 6–20)
CO2: 24 mmol/L (ref 22–32)
Calcium: 9.3 mg/dL (ref 8.9–10.3)
Chloride: 104 mmol/L (ref 98–111)
Creatinine, Ser: 0.98 mg/dL (ref 0.61–1.24)
GFR, Estimated: >60 mL/min (ref >60)
Glucose, Bld: 99 mg/dL (ref 70–99)
Potassium: 3.8 mmol/L (ref 3.5–5.1)
Sodium: 141 mmol/L (ref 135–145)
Total Bilirubin: 0.8 mg/dL (ref 0.0–1.2)
Total Protein: 8.2 g/dL — ABNORMAL HIGH (ref 6.5–8.1)

## 2024-04-17 LAB — CHLAMYDIA/NGC RT PCR (ARMC ONLY)
Chlamydia Tr: NOT DETECTED
Chlamydia Tr: NOT DETECTED
N gonorrhoeae: NOT DETECTED
N gonorrhoeae: NOT DETECTED

## 2024-04-17 LAB — HIV ANTIBODY (ROUTINE TESTING W REFLEX): HIV Screen 4th Generation wRfx: NONREACTIVE

## 2024-04-17 MED ORDER — EMTRICITABINE-TENOFOVIR DF 200-300 MG PO TABS: 1.0000 | ORAL_TABLET | Freq: Every day | ORAL | 3 refills | Status: AC

## 2024-04-17 NOTE — Patient Instructions (Addendum)
 TODAY WE WILL DO LABS FOR STD . WE WILL CONTINUE TRUVADA. YOU GOT 2ND DOSE OF HPV VACCINE AND FLU VACCINE TODAY. FOLLOW UP LABS  3 MONTHS

## 2024-04-17 NOTE — Telephone Encounter (Signed)
 Patient called to get results from labs done today 04/17/24, said he has received a notification but has not been able to reach anyone. Best contact number is [phone].

## 2024-04-17 NOTE — Progress Notes (Signed)
 ID PREP Visit NAME: John Spence  DOB: April 04, 1991  MRN: [id number]  Date/Time: 04/17/2024 11:31 AM Subjective:  Multiple No show Says he is anxious when he has to go to a doctors appt  PreP visit John Spence is a 33 y.o. with no PMH On truvada  Risk-MSM Partners in the past 6 months-1 Partner is HIV positive -undetectable on HAART In a monogamous relationship  PMH none PSH none  Social History   Socioeconomic History   Marital status: Single    Spouse name: Not on file   Number of children: Not on file   Years of education: Not on file   Highest education level: Not on file  Occupational History   Not on file  Tobacco Use   Smoking status: Former    Current packs/day: 0.00    Types: Cigarettes    Quit date: 04/10/2024    Years since quitting: 0.0   Smokeless tobacco: Never   Tobacco comments:    down to 2 cigarettes  Vaping Use   Vaping status: Never Used  Substance and Sexual Activity   Alcohol use: Yes    Comment: Occasional   Drug use: Not Currently    Types: Marijuana   Sexual activity: Yes    Birth control/protection: Condom  Other Topics Concern   Not on file  Social History Narrative   Not on file   Social Drivers of Health   Financial Resource Strain: Not on file  Food Insecurity: Not on file  Transportation Needs: Not on file  Physical Activity: Not on file  Stress: Not on file  Social Connections: Not on file  Intimate Partner Violence: Not on file    FH Mom -HTN Grandfather- lung cancer, DM No Known Allergies ? Current Outpatient Medications  Medication Sig Dispense Refill   emtricitabine -tenofovir  (TRUVADA) 200-300 MG tablet Take 1 tablet by mouth once daily 30 tablet 0   No current facility-administered medications for this visit.    REVIEW OF SYSTEMS:  Const: negative fever, negative chills, negative weight loss Eyes: negative diplopia or visual changes, negative eye pain ENT: negative coryza, negative sore throat Resp:  negative cough, hemoptysis, dyspnea Cards: negative for chest pain, palpitations, lower extremity edema GU: negative for frequency, dysuria and hematuria Skin: negative for rash and pruritus Heme: negative for easy bruising and gum/nose bleeding MS: negative for myalgias, arthralgias, back pain and muscle weakness Neurolo:negative for headaches, dizziness, vertigo, memory problems  Psych: anxiety  ? Objective:  VITALS:  BP (!) 141/101   Pulse 99   Temp (!) 96.6 F (35.9 C) (Temporal)   Ht 5' 11 (1.803 m)   Wt 173 lb (78.5 kg)   BMI 24.13 kg/m   General: Alert, cooperative, no distress, appears stated age.  Head: Normocephalic, without obvious abnormality, atraumatic. Eyes: Conjunctivae clear, anicteric sclerae. Pupils are equal Nose: Nares normal. No drainage or sinus tenderness. Throat: Lips, mucosa, and tongue normal. No Thrush Neck: Supple, symmetrical, no adenopathy, thyroid: non tender no carotid bruit and no JVD. Back: No CVA tenderness. Lungs: Clear to auscultation bilaterally. No Wheezing or Rhonchi. No rales. Heart: Regular rate and rhythm, no murmur, rub or gallop. Abdomen: Soft, non-tender,not distended. Bowel sounds normal. No masses Extremities: Extremities normal, atraumatic, no cyanosis. No edema. No clubbing Skin: No rashes or lesions. Not Jaundiced Lymph: Cervical, supraclavicular normal. Neurologic: Grossly non-focal  Impression/Recommendation  Pre exposure prophylaxis for HIV with truvada Is 100% adherent Partner is undetectable Discussed Undetectable =Untransamittable in monogamous relationship- he  wants to stay on the medication Exploring lenacapavir 6 monthly injection   Discussed how to recognize newly acquired HIv infection Will do labs today- STD screening  Discussed vaccination-  2nd dose of HPV vaccine given today along with flu ( his first dose was in Feb- he missed appts after that-dont need to restart the series)) 3rd dose in 3  months HEPB needed  Discussed the management with the patient in detail   Follow up 3 months labs and Nursing visit for HPV and follow up with me in 6 months

## 2024-04-17 NOTE — Telephone Encounter (Signed)
Communicated with the patient via mychart.

## 2024-04-18 ENCOUNTER — Ambulatory Visit: Payer: Self-pay | Admitting: Infectious Diseases

## 2024-04-18 LAB — RPR: RPR Ser Ql: NONREACTIVE

## 2024-07-17 ENCOUNTER — Ambulatory Visit

## 2024-07-18 ENCOUNTER — Other Ambulatory Visit (HOSPITAL_COMMUNITY): Payer: Self-pay

## 2024-07-18 ENCOUNTER — Other Ambulatory Visit: Payer: Self-pay

## 2024-07-18 ENCOUNTER — Other Ambulatory Visit
Admission: RE | Admit: 2024-07-18 | Discharge: 2024-07-18 | Disposition: A | Source: Home / Self Care | Attending: Infectious Diseases | Admitting: Infectious Diseases

## 2024-07-18 ENCOUNTER — Telehealth: Payer: Self-pay

## 2024-07-18 DIAGNOSIS — Z9189 Other specified personal risk factors, not elsewhere classified: Secondary | ICD-10-CM

## 2024-07-18 DIAGNOSIS — Z113 Encounter for screening for infections with a predominantly sexual mode of transmission: Secondary | ICD-10-CM

## 2024-07-18 LAB — CBC WITH DIFFERENTIAL/PLATELET
Abs Immature Granulocytes: 0.01 10*3/uL (ref 0.00–0.07)
Basophils Absolute: 0.1 10*3/uL (ref 0.0–0.1)
Basophils Relative: 1 %
Eosinophils Absolute: 0.2 10*3/uL (ref 0.0–0.5)
Eosinophils Relative: 3 %
HCT: 45.2 % (ref 39.0–52.0)
Hemoglobin: 15.1 g/dL (ref 13.0–17.0)
Immature Granulocytes: 0 %
Lymphocytes Relative: 41 %
Lymphs Abs: 2.3 10*3/uL (ref 0.7–4.0)
MCH: 33.1 pg (ref 26.0–34.0)
MCHC: 33.4 g/dL (ref 30.0–36.0)
MCV: 99.1 fL (ref 80.0–100.0)
Monocytes Absolute: 0.6 10*3/uL (ref 0.1–1.0)
Monocytes Relative: 11 %
Neutro Abs: 2.4 10*3/uL (ref 1.7–7.7)
Neutrophils Relative %: 44 %
Platelets: 297 10*3/uL (ref 150–400)
RBC: 4.56 MIL/uL (ref 4.22–5.81)
RDW: 13.3 % (ref 11.5–15.5)
WBC: 5.5 10*3/uL (ref 4.0–10.5)
nRBC: 0 % (ref 0.0–0.2)

## 2024-07-18 LAB — COMPREHENSIVE METABOLIC PANEL WITH GFR
ALT: 18 U/L (ref 0–44)
AST: 20 U/L (ref 15–41)
Albumin: 4.7 g/dL (ref 3.5–5.0)
Alkaline Phosphatase: 112 U/L (ref 38–126)
Anion gap: 9 (ref 5–15)
BUN: 15 mg/dL (ref 6–20)
CO2: 27 mmol/L (ref 22–32)
Calcium: 9.1 mg/dL (ref 8.9–10.3)
Chloride: 108 mmol/L (ref 98–111)
Creatinine, Ser: 1.06 mg/dL (ref 0.61–1.24)
GFR, Estimated: >60 mL/min
Glucose, Bld: 97 mg/dL (ref 70–99)
Potassium: 3.9 mmol/L (ref 3.5–5.1)
Sodium: 144 mmol/L (ref 135–145)
Total Bilirubin: 0.5 mg/dL (ref 0.0–1.2)
Total Protein: 7.4 g/dL (ref 6.5–8.1)

## 2024-07-18 NOTE — Telephone Encounter (Signed)
 Pharmacy Patient Advocate Encounter  Insurance verification completed.   The patient is insured through [employer]   Ran test claim for Apretude . Currently a quantity of 3ml is a 30 day supply and the co-pay is 0.00  . Yeztugo Tablets $0.00 Yeztugo Injection $4.00  This test claim was processed through [facility]- copay amounts may vary at other pharmacies due to [employer], or as the patient moves through the different stages of their insurance plan.

## 2024-07-19 ENCOUNTER — Ambulatory Visit

## 2024-07-19 DIAGNOSIS — Z23 Encounter for immunization: Secondary | ICD-10-CM

## 2024-07-19 LAB — SYPHILIS: RPR W/REFLEX TO RPR TITER AND TREPONEMAL ANTIBODIES, TRADITIONAL SCREENING AND DIAGNOSIS ALGORITHM: RPR Ser Ql: NONREACTIVE

## 2024-07-19 LAB — HIV ANTIBODY (ROUTINE TESTING W REFLEX): HIV Screen 4th Generation wRfx: NONREACTIVE

## 2024-07-24 ENCOUNTER — Ambulatory Visit

## 2024-10-16 ENCOUNTER — Ambulatory Visit: Admitting: Infectious Diseases
